# Patient Record
Sex: Female | Born: 1965 | Race: Black or African American | Hispanic: No | Marital: Married | Smoking: Never smoker
Health system: Southern US, Community
[De-identification: ages and names within clinical notes are randomized; demographics above are authoritative.]

## PROBLEM LIST (undated history)

## (undated) DIAGNOSIS — E039 Hypothyroidism, unspecified: Secondary | ICD-10-CM

## (undated) DIAGNOSIS — R519 Headache, unspecified: Secondary | ICD-10-CM

## (undated) DIAGNOSIS — R51 Headache: Secondary | ICD-10-CM

## (undated) DIAGNOSIS — I773 Arterial fibromuscular dysplasia: Secondary | ICD-10-CM

## (undated) DIAGNOSIS — I10 Essential (primary) hypertension: Secondary | ICD-10-CM

## (undated) DIAGNOSIS — F419 Anxiety disorder, unspecified: Secondary | ICD-10-CM

## (undated) DIAGNOSIS — R42 Dizziness and giddiness: Secondary | ICD-10-CM

## (undated) DIAGNOSIS — I639 Cerebral infarction, unspecified: Secondary | ICD-10-CM

## (undated) HISTORY — DX: Dizziness and giddiness: R42

## (undated) HISTORY — PX: NO PAST SURGERIES: SHX2092

## (undated) HISTORY — DX: Arterial fibromuscular dysplasia: I77.3

## (undated) HISTORY — DX: Cerebral infarction, unspecified: I63.9

---

## 1998-09-01 ENCOUNTER — Other Ambulatory Visit: Admission: RE | Admit: 1998-09-01 | Discharge: 1998-09-01 | Payer: Self-pay | Admitting: Obstetrics & Gynecology

## 1999-10-26 ENCOUNTER — Emergency Department (HOSPITAL_COMMUNITY): Admission: EM | Admit: 1999-10-26 | Discharge: 1999-10-26 | Payer: Self-pay | Admitting: Gastroenterology

## 1999-11-01 ENCOUNTER — Emergency Department (HOSPITAL_COMMUNITY): Admission: EM | Admit: 1999-11-01 | Discharge: 1999-11-02 | Payer: Self-pay

## 1999-11-16 ENCOUNTER — Ambulatory Visit (HOSPITAL_COMMUNITY): Admission: RE | Admit: 1999-11-16 | Discharge: 1999-11-16 | Payer: Self-pay | Admitting: Family Medicine

## 2000-05-23 ENCOUNTER — Other Ambulatory Visit: Admission: RE | Admit: 2000-05-23 | Discharge: 2000-05-23 | Payer: Self-pay | Admitting: Obstetrics & Gynecology

## 2002-09-22 ENCOUNTER — Encounter: Payer: Self-pay | Admitting: Emergency Medicine

## 2002-09-22 ENCOUNTER — Emergency Department (HOSPITAL_COMMUNITY): Admission: EM | Admit: 2002-09-22 | Discharge: 2002-09-22 | Payer: Self-pay | Admitting: Emergency Medicine

## 2002-10-25 ENCOUNTER — Other Ambulatory Visit: Admission: RE | Admit: 2002-10-25 | Discharge: 2002-10-25 | Payer: Self-pay | Admitting: Obstetrics & Gynecology

## 2003-06-27 ENCOUNTER — Inpatient Hospital Stay (HOSPITAL_COMMUNITY): Admission: AD | Admit: 2003-06-27 | Discharge: 2003-07-04 | Payer: Self-pay | Admitting: Emergency Medicine

## 2003-06-27 ENCOUNTER — Emergency Department (HOSPITAL_COMMUNITY): Admission: EM | Admit: 2003-06-27 | Discharge: 2003-06-27 | Payer: Self-pay

## 2003-08-06 ENCOUNTER — Encounter: Payer: Self-pay | Admitting: Emergency Medicine

## 2003-08-06 ENCOUNTER — Emergency Department (HOSPITAL_COMMUNITY): Admission: EM | Admit: 2003-08-06 | Discharge: 2003-08-07 | Payer: Self-pay | Admitting: Emergency Medicine

## 2003-12-09 ENCOUNTER — Other Ambulatory Visit: Admission: RE | Admit: 2003-12-09 | Discharge: 2003-12-09 | Payer: Self-pay | Admitting: Family Medicine

## 2004-01-05 ENCOUNTER — Other Ambulatory Visit: Admission: RE | Admit: 2004-01-05 | Discharge: 2004-01-05 | Payer: Self-pay | Admitting: Obstetrics & Gynecology

## 2004-04-10 ENCOUNTER — Emergency Department (HOSPITAL_COMMUNITY): Admission: EM | Admit: 2004-04-10 | Discharge: 2004-04-11 | Payer: Self-pay | Admitting: Emergency Medicine

## 2006-09-18 ENCOUNTER — Emergency Department (HOSPITAL_COMMUNITY): Admission: EM | Admit: 2006-09-18 | Discharge: 2006-09-19 | Payer: Self-pay | Admitting: Emergency Medicine

## 2011-12-16 DIAGNOSIS — E039 Hypothyroidism, unspecified: Secondary | ICD-10-CM | POA: Diagnosis not present

## 2011-12-23 DIAGNOSIS — E0590 Thyrotoxicosis, unspecified without thyrotoxic crisis or storm: Secondary | ICD-10-CM | POA: Diagnosis not present

## 2012-01-06 DIAGNOSIS — R928 Other abnormal and inconclusive findings on diagnostic imaging of breast: Secondary | ICD-10-CM | POA: Diagnosis not present

## 2012-05-30 DIAGNOSIS — Z1151 Encounter for screening for human papillomavirus (HPV): Secondary | ICD-10-CM | POA: Diagnosis not present

## 2012-05-30 DIAGNOSIS — Z1289 Encounter for screening for malignant neoplasm of other sites: Secondary | ICD-10-CM | POA: Diagnosis not present

## 2012-05-30 DIAGNOSIS — F329 Major depressive disorder, single episode, unspecified: Secondary | ICD-10-CM | POA: Diagnosis not present

## 2012-05-30 DIAGNOSIS — F341 Dysthymic disorder: Secondary | ICD-10-CM | POA: Diagnosis not present

## 2012-05-30 DIAGNOSIS — Z124 Encounter for screening for malignant neoplasm of cervix: Secondary | ICD-10-CM | POA: Diagnosis not present

## 2012-05-30 DIAGNOSIS — E0590 Thyrotoxicosis, unspecified without thyrotoxic crisis or storm: Secondary | ICD-10-CM | POA: Diagnosis not present

## 2012-05-30 DIAGNOSIS — R030 Elevated blood-pressure reading, without diagnosis of hypertension: Secondary | ICD-10-CM | POA: Diagnosis not present

## 2012-05-30 DIAGNOSIS — Z0142 Encounter for cervical smear to confirm findings of recent normal smear following initial abnormal smear: Secondary | ICD-10-CM | POA: Diagnosis not present

## 2012-05-30 DIAGNOSIS — Z79899 Other long term (current) drug therapy: Secondary | ICD-10-CM | POA: Diagnosis not present

## 2012-06-06 DIAGNOSIS — Z1231 Encounter for screening mammogram for malignant neoplasm of breast: Secondary | ICD-10-CM | POA: Diagnosis not present

## 2012-06-08 DIAGNOSIS — E0590 Thyrotoxicosis, unspecified without thyrotoxic crisis or storm: Secondary | ICD-10-CM | POA: Diagnosis not present

## 2012-12-27 DIAGNOSIS — E0590 Thyrotoxicosis, unspecified without thyrotoxic crisis or storm: Secondary | ICD-10-CM | POA: Diagnosis not present

## 2013-05-22 DIAGNOSIS — F3130 Bipolar disorder, current episode depressed, mild or moderate severity, unspecified: Secondary | ICD-10-CM | POA: Diagnosis not present

## 2013-07-24 DIAGNOSIS — F319 Bipolar disorder, unspecified: Secondary | ICD-10-CM | POA: Diagnosis not present

## 2013-07-24 DIAGNOSIS — E785 Hyperlipidemia, unspecified: Secondary | ICD-10-CM | POA: Diagnosis not present

## 2013-07-24 DIAGNOSIS — E0590 Thyrotoxicosis, unspecified without thyrotoxic crisis or storm: Secondary | ICD-10-CM | POA: Diagnosis not present

## 2013-08-20 DIAGNOSIS — F3130 Bipolar disorder, current episode depressed, mild or moderate severity, unspecified: Secondary | ICD-10-CM | POA: Diagnosis not present

## 2013-09-16 DIAGNOSIS — Z23 Encounter for immunization: Secondary | ICD-10-CM | POA: Diagnosis not present

## 2013-09-16 DIAGNOSIS — E0590 Thyrotoxicosis, unspecified without thyrotoxic crisis or storm: Secondary | ICD-10-CM | POA: Diagnosis not present

## 2013-10-14 DIAGNOSIS — E0590 Thyrotoxicosis, unspecified without thyrotoxic crisis or storm: Secondary | ICD-10-CM | POA: Diagnosis not present

## 2013-10-29 DIAGNOSIS — R05 Cough: Secondary | ICD-10-CM | POA: Diagnosis not present

## 2013-10-29 DIAGNOSIS — R059 Cough, unspecified: Secondary | ICD-10-CM | POA: Diagnosis not present

## 2013-10-29 DIAGNOSIS — R062 Wheezing: Secondary | ICD-10-CM | POA: Diagnosis not present

## 2013-10-29 DIAGNOSIS — J209 Acute bronchitis, unspecified: Secondary | ICD-10-CM | POA: Diagnosis not present

## 2013-10-29 DIAGNOSIS — S01309A Unspecified open wound of unspecified ear, initial encounter: Secondary | ICD-10-CM | POA: Diagnosis not present

## 2013-11-18 DIAGNOSIS — Z1231 Encounter for screening mammogram for malignant neoplasm of breast: Secondary | ICD-10-CM | POA: Diagnosis not present

## 2013-11-27 DIAGNOSIS — E785 Hyperlipidemia, unspecified: Secondary | ICD-10-CM | POA: Diagnosis not present

## 2014-01-07 DIAGNOSIS — F3130 Bipolar disorder, current episode depressed, mild or moderate severity, unspecified: Secondary | ICD-10-CM | POA: Diagnosis not present

## 2014-01-13 DIAGNOSIS — E0590 Thyrotoxicosis, unspecified without thyrotoxic crisis or storm: Secondary | ICD-10-CM | POA: Diagnosis not present

## 2014-01-17 DIAGNOSIS — E0590 Thyrotoxicosis, unspecified without thyrotoxic crisis or storm: Secondary | ICD-10-CM | POA: Diagnosis not present

## 2014-01-17 DIAGNOSIS — E0500 Thyrotoxicosis with diffuse goiter without thyrotoxic crisis or storm: Secondary | ICD-10-CM | POA: Diagnosis not present

## 2014-02-14 DIAGNOSIS — E0590 Thyrotoxicosis, unspecified without thyrotoxic crisis or storm: Secondary | ICD-10-CM | POA: Diagnosis not present

## 2014-03-18 DIAGNOSIS — Z0000 Encounter for general adult medical examination without abnormal findings: Secondary | ICD-10-CM | POA: Diagnosis not present

## 2014-03-18 DIAGNOSIS — E669 Obesity, unspecified: Secondary | ICD-10-CM | POA: Diagnosis not present

## 2014-03-18 DIAGNOSIS — Z6833 Body mass index (BMI) 33.0-33.9, adult: Secondary | ICD-10-CM | POA: Diagnosis not present

## 2014-03-18 DIAGNOSIS — E785 Hyperlipidemia, unspecified: Secondary | ICD-10-CM | POA: Diagnosis not present

## 2014-03-18 DIAGNOSIS — Z1331 Encounter for screening for depression: Secondary | ICD-10-CM | POA: Diagnosis not present

## 2014-03-18 DIAGNOSIS — E0590 Thyrotoxicosis, unspecified without thyrotoxic crisis or storm: Secondary | ICD-10-CM | POA: Diagnosis not present

## 2014-04-16 DIAGNOSIS — E0590 Thyrotoxicosis, unspecified without thyrotoxic crisis or storm: Secondary | ICD-10-CM | POA: Diagnosis not present

## 2014-04-21 DIAGNOSIS — R059 Cough, unspecified: Secondary | ICD-10-CM | POA: Diagnosis not present

## 2014-04-21 DIAGNOSIS — R05 Cough: Secondary | ICD-10-CM | POA: Diagnosis not present

## 2014-05-07 DIAGNOSIS — F3130 Bipolar disorder, current episode depressed, mild or moderate severity, unspecified: Secondary | ICD-10-CM | POA: Diagnosis not present

## 2014-05-28 DIAGNOSIS — E0590 Thyrotoxicosis, unspecified without thyrotoxic crisis or storm: Secondary | ICD-10-CM | POA: Diagnosis not present

## 2014-07-01 DIAGNOSIS — E0590 Thyrotoxicosis, unspecified without thyrotoxic crisis or storm: Secondary | ICD-10-CM | POA: Diagnosis not present

## 2014-08-29 DIAGNOSIS — E0500 Thyrotoxicosis with diffuse goiter without thyrotoxic crisis or storm: Secondary | ICD-10-CM | POA: Diagnosis not present

## 2014-08-29 DIAGNOSIS — E0590 Thyrotoxicosis, unspecified without thyrotoxic crisis or storm: Secondary | ICD-10-CM | POA: Diagnosis not present

## 2014-09-04 DIAGNOSIS — F250 Schizoaffective disorder, bipolar type: Secondary | ICD-10-CM | POA: Diagnosis not present

## 2014-09-08 DIAGNOSIS — I10 Essential (primary) hypertension: Secondary | ICD-10-CM | POA: Diagnosis not present

## 2014-09-08 DIAGNOSIS — E785 Hyperlipidemia, unspecified: Secondary | ICD-10-CM | POA: Diagnosis not present

## 2014-10-06 DIAGNOSIS — E0590 Thyrotoxicosis, unspecified without thyrotoxic crisis or storm: Secondary | ICD-10-CM | POA: Diagnosis not present

## 2014-11-17 DIAGNOSIS — E0590 Thyrotoxicosis, unspecified without thyrotoxic crisis or storm: Secondary | ICD-10-CM | POA: Diagnosis not present

## 2014-12-26 DIAGNOSIS — E0590 Thyrotoxicosis, unspecified without thyrotoxic crisis or storm: Secondary | ICD-10-CM | POA: Diagnosis not present

## 2014-12-26 DIAGNOSIS — E0500 Thyrotoxicosis with diffuse goiter without thyrotoxic crisis or storm: Secondary | ICD-10-CM | POA: Diagnosis not present

## 2015-01-01 DIAGNOSIS — F250 Schizoaffective disorder, bipolar type: Secondary | ICD-10-CM | POA: Diagnosis not present

## 2015-02-02 DIAGNOSIS — E0590 Thyrotoxicosis, unspecified without thyrotoxic crisis or storm: Secondary | ICD-10-CM | POA: Diagnosis not present

## 2015-03-03 DIAGNOSIS — Z124 Encounter for screening for malignant neoplasm of cervix: Secondary | ICD-10-CM | POA: Diagnosis not present

## 2015-03-03 DIAGNOSIS — Z1231 Encounter for screening mammogram for malignant neoplasm of breast: Secondary | ICD-10-CM | POA: Diagnosis not present

## 2015-03-03 DIAGNOSIS — Z1212 Encounter for screening for malignant neoplasm of rectum: Secondary | ICD-10-CM | POA: Diagnosis not present

## 2015-03-06 DIAGNOSIS — E0500 Thyrotoxicosis with diffuse goiter without thyrotoxic crisis or storm: Secondary | ICD-10-CM | POA: Diagnosis not present

## 2015-03-06 DIAGNOSIS — E0590 Thyrotoxicosis, unspecified without thyrotoxic crisis or storm: Secondary | ICD-10-CM | POA: Diagnosis not present

## 2015-03-11 ENCOUNTER — Other Ambulatory Visit: Payer: Self-pay | Admitting: Obstetrics & Gynecology

## 2015-03-11 DIAGNOSIS — R928 Other abnormal and inconclusive findings on diagnostic imaging of breast: Secondary | ICD-10-CM

## 2015-03-17 ENCOUNTER — Ambulatory Visit
Admission: RE | Admit: 2015-03-17 | Discharge: 2015-03-17 | Disposition: A | Discharge status: Home / Self Care | Payer: Medicare Other | Source: Ambulatory Visit | Attending: Obstetrics & Gynecology | Admitting: Obstetrics & Gynecology

## 2015-03-17 DIAGNOSIS — R921 Mammographic calcification found on diagnostic imaging of breast: Secondary | ICD-10-CM | POA: Diagnosis not present

## 2015-03-17 DIAGNOSIS — R928 Other abnormal and inconclusive findings on diagnostic imaging of breast: Secondary | ICD-10-CM

## 2015-03-20 DIAGNOSIS — E0500 Thyrotoxicosis with diffuse goiter without thyrotoxic crisis or storm: Secondary | ICD-10-CM | POA: Diagnosis not present

## 2015-03-20 DIAGNOSIS — E785 Hyperlipidemia, unspecified: Secondary | ICD-10-CM | POA: Diagnosis not present

## 2015-03-20 DIAGNOSIS — Z1389 Encounter for screening for other disorder: Secondary | ICD-10-CM | POA: Diagnosis not present

## 2015-03-20 DIAGNOSIS — F3170 Bipolar disorder, currently in remission, most recent episode unspecified: Secondary | ICD-10-CM | POA: Diagnosis not present

## 2015-03-20 DIAGNOSIS — Z0000 Encounter for general adult medical examination without abnormal findings: Secondary | ICD-10-CM | POA: Diagnosis not present

## 2015-04-02 DIAGNOSIS — R05 Cough: Secondary | ICD-10-CM | POA: Diagnosis not present

## 2015-06-05 DIAGNOSIS — E0590 Thyrotoxicosis, unspecified without thyrotoxic crisis or storm: Secondary | ICD-10-CM | POA: Diagnosis not present

## 2015-07-09 DIAGNOSIS — F250 Schizoaffective disorder, bipolar type: Secondary | ICD-10-CM | POA: Diagnosis not present

## 2015-07-13 DIAGNOSIS — E0590 Thyrotoxicosis, unspecified without thyrotoxic crisis or storm: Secondary | ICD-10-CM | POA: Diagnosis not present

## 2015-08-17 DIAGNOSIS — E0590 Thyrotoxicosis, unspecified without thyrotoxic crisis or storm: Secondary | ICD-10-CM | POA: Diagnosis not present

## 2015-08-24 ENCOUNTER — Other Ambulatory Visit: Payer: Self-pay | Admitting: Obstetrics & Gynecology

## 2015-08-24 DIAGNOSIS — F419 Anxiety disorder, unspecified: Secondary | ICD-10-CM | POA: Diagnosis not present

## 2015-08-24 DIAGNOSIS — R921 Mammographic calcification found on diagnostic imaging of breast: Secondary | ICD-10-CM

## 2015-08-24 DIAGNOSIS — R079 Chest pain, unspecified: Secondary | ICD-10-CM | POA: Diagnosis not present

## 2015-08-24 DIAGNOSIS — M940 Chondrocostal junction syndrome [Tietze]: Secondary | ICD-10-CM | POA: Diagnosis not present

## 2015-09-09 ENCOUNTER — Ambulatory Visit
Admission: RE | Admit: 2015-09-09 | Discharge: 2015-09-09 | Disposition: A | Discharge status: Home / Self Care | Payer: Medicare Other | Source: Ambulatory Visit | Attending: Obstetrics & Gynecology | Admitting: Obstetrics & Gynecology

## 2015-09-09 ENCOUNTER — Other Ambulatory Visit: Payer: Self-pay | Admitting: Obstetrics & Gynecology

## 2015-09-09 DIAGNOSIS — N6320 Unspecified lump in the left breast, unspecified quadrant: Secondary | ICD-10-CM

## 2015-09-09 DIAGNOSIS — R921 Mammographic calcification found on diagnostic imaging of breast: Secondary | ICD-10-CM

## 2015-09-09 DIAGNOSIS — N6002 Solitary cyst of left breast: Secondary | ICD-10-CM | POA: Diagnosis not present

## 2015-09-14 DIAGNOSIS — E0590 Thyrotoxicosis, unspecified without thyrotoxic crisis or storm: Secondary | ICD-10-CM | POA: Diagnosis not present

## 2015-09-15 DIAGNOSIS — E032 Hypothyroidism due to medicaments and other exogenous substances: Secondary | ICD-10-CM | POA: Diagnosis not present

## 2015-09-15 DIAGNOSIS — Z23 Encounter for immunization: Secondary | ICD-10-CM | POA: Diagnosis not present

## 2015-09-15 DIAGNOSIS — E0500 Thyrotoxicosis with diffuse goiter without thyrotoxic crisis or storm: Secondary | ICD-10-CM | POA: Diagnosis not present

## 2015-10-08 DIAGNOSIS — Z1211 Encounter for screening for malignant neoplasm of colon: Secondary | ICD-10-CM | POA: Diagnosis not present

## 2015-10-08 DIAGNOSIS — E785 Hyperlipidemia, unspecified: Secondary | ICD-10-CM | POA: Diagnosis not present

## 2015-10-14 DIAGNOSIS — E0500 Thyrotoxicosis with diffuse goiter without thyrotoxic crisis or storm: Secondary | ICD-10-CM | POA: Diagnosis not present

## 2015-10-14 DIAGNOSIS — E785 Hyperlipidemia, unspecified: Secondary | ICD-10-CM | POA: Diagnosis not present

## 2015-12-09 ENCOUNTER — Other Ambulatory Visit: Payer: Self-pay | Admitting: Gastroenterology

## 2015-12-25 DIAGNOSIS — E0500 Thyrotoxicosis with diffuse goiter without thyrotoxic crisis or storm: Secondary | ICD-10-CM | POA: Diagnosis not present

## 2016-01-06 DIAGNOSIS — F250 Schizoaffective disorder, bipolar type: Secondary | ICD-10-CM | POA: Diagnosis not present

## 2016-01-18 ENCOUNTER — Encounter (HOSPITAL_COMMUNITY): Payer: Self-pay | Admitting: *Deleted

## 2016-01-26 ENCOUNTER — Ambulatory Visit (HOSPITAL_COMMUNITY): Payer: Medicare Other | Admitting: Anesthesiology

## 2016-01-26 ENCOUNTER — Encounter (HOSPITAL_COMMUNITY): Payer: Self-pay

## 2016-01-26 ENCOUNTER — Encounter (HOSPITAL_COMMUNITY)
Admission: RE | Disposition: A | Discharge status: Home / Self Care | Payer: Self-pay | Source: Ambulatory Visit | Attending: Gastroenterology

## 2016-01-26 ENCOUNTER — Ambulatory Visit (HOSPITAL_COMMUNITY)
Admission: RE | Admit: 2016-01-26 | Discharge: 2016-01-26 | Disposition: A | Discharge status: Home / Self Care | Payer: Medicare Other | Source: Ambulatory Visit | Attending: Gastroenterology | Admitting: Gastroenterology

## 2016-01-26 DIAGNOSIS — F419 Anxiety disorder, unspecified: Secondary | ICD-10-CM | POA: Diagnosis not present

## 2016-01-26 DIAGNOSIS — I10 Essential (primary) hypertension: Secondary | ICD-10-CM | POA: Diagnosis not present

## 2016-01-26 DIAGNOSIS — F319 Bipolar disorder, unspecified: Secondary | ICD-10-CM | POA: Diagnosis not present

## 2016-01-26 DIAGNOSIS — E0590 Thyrotoxicosis, unspecified without thyrotoxic crisis or storm: Secondary | ICD-10-CM | POA: Insufficient documentation

## 2016-01-26 DIAGNOSIS — Z1211 Encounter for screening for malignant neoplasm of colon: Secondary | ICD-10-CM | POA: Diagnosis not present

## 2016-01-26 DIAGNOSIS — E7800 Pure hypercholesterolemia, unspecified: Secondary | ICD-10-CM | POA: Insufficient documentation

## 2016-01-26 HISTORY — PX: COLONOSCOPY WITH PROPOFOL: SHX5780

## 2016-01-26 HISTORY — DX: Anxiety disorder, unspecified: F41.9

## 2016-01-26 HISTORY — DX: Headache, unspecified: R51.9

## 2016-01-26 HISTORY — DX: Essential (primary) hypertension: I10

## 2016-01-26 HISTORY — DX: Headache: R51

## 2016-01-26 HISTORY — DX: Hypothyroidism, unspecified: E03.9

## 2016-01-26 SURGERY — COLONOSCOPY WITH PROPOFOL
Anesthesia: Monitor Anesthesia Care

## 2016-01-26 MED ORDER — PROPOFOL 10 MG/ML IV BOLUS
INTRAVENOUS | Status: AC
  Filled 2016-01-26: qty 40

## 2016-01-26 MED ORDER — LIDOCAINE HCL (CARDIAC) 20 MG/ML IV SOLN
INTRAVENOUS | Status: AC
  Filled 2016-01-26: qty 5

## 2016-01-26 MED ORDER — LACTATED RINGERS IV SOLN
INTRAVENOUS | Status: DC
  Administered 2016-01-26: 12:00:00 via INTRAVENOUS

## 2016-01-26 MED ORDER — LIDOCAINE HCL (CARDIAC) 20 MG/ML IV SOLN
INTRAVENOUS | Status: DC | PRN
  Administered 2016-01-26: 50 mg via INTRAVENOUS

## 2016-01-26 MED ORDER — PROPOFOL 10 MG/ML IV BOLUS
INTRAVENOUS | Status: DC | PRN
  Administered 2016-01-26 (×2): 20 mg via INTRAVENOUS

## 2016-01-26 MED ORDER — SODIUM CHLORIDE 0.9 % IV SOLN: INTRAVENOUS | Status: DC

## 2016-01-26 MED ORDER — PROPOFOL 500 MG/50ML IV EMUL
INTRAVENOUS | Status: DC | PRN
  Administered 2016-01-26: 130 ug/kg/min via INTRAVENOUS

## 2016-01-26 MED ORDER — PROPOFOL 10 MG/ML IV BOLUS: INTRAVENOUS | Status: DC | PRN

## 2016-01-26 SURGICAL SUPPLY — 22 items

## 2016-01-26 NOTE — Anesthesia Preprocedure Evaluation (Addendum)
Anesthesia Evaluation  Patient identified by MRN, date of birth, ID band Patient awake    Reviewed: Allergy & Precautions, NPO status , Patient's Chart, lab work & pertinent test results  Airway Mallampati: II  TM Distance: >3 FB Neck ROM: Full    Dental   Pulmonary neg pulmonary ROS,    breath sounds clear to auscultation       Cardiovascular hypertension, Pt. on medications and Pt. on home beta blockers  Rhythm:Regular Rate:Normal     Neuro/Psych Anxiety negative neurological ROS     GI/Hepatic negative GI ROS, Neg liver ROS,   Endo/Other  Hyperthyroidism   Renal/GU negative Renal ROS     Musculoskeletal   Abdominal   Peds  Hematology negative hematology ROS (+)   Anesthesia Other Findings   Reproductive/Obstetrics                            Anesthesia Physical Anesthesia Plan  ASA: II  Anesthesia Plan: MAC   Post-op Pain Management:    Induction: Intravenous  Airway Management Planned: Natural Airway and Simple Face Mask  Additional Equipment:   Intra-op Plan:   Post-operative Plan:   Informed Consent: I have reviewed the patients History and Physical, chart, labs and discussed the procedure including the risks, benefits and alternatives for the proposed anesthesia with the patient or authorized representative who has indicated his/her understanding and acceptance.     Plan Discussed with: CRNA  Anesthesia Plan Comments:         Anesthesia Quick Evaluation

## 2016-01-26 NOTE — Op Note (Signed)
[facility] Patient Name: Deborah SchatzCia Piatkowski Procedure Date: 01/26/2016 MRN: [id number] Attending MD: Charolett BumpersMartin K Johnson , MD Date of Birth: [birth year] CSN:  Age: 50 Admit Type: Outpatient Procedure:                Colonoscopy Indications:              Screening for colorectal malignant neoplasm Providers:                Charolett BumpersMartin K. Johnson, MD, Anthony Saraniel Madden, RN, Lorenda IshiharaSam                            Tetteh, Technician, Jarvis NewcomerLacey Armistead, CRNA Referring MD:              Medicines:                Propofol per Anesthesia Complications:            No immediate complications. Estimated Blood Loss:     Estimated blood loss: none. Procedure:                Pre-Anesthesia Assessment:                           - Prior to the procedure, a History and Physical                            was performed, and patient medications and                            allergies were reviewed. The patient's tolerance of                            previous anesthesia was also reviewed. The risks                            and benefits of the procedure and the sedation                            options and risks were discussed with the patient.                            All questions were answered, and informed consent                            was obtained. Prior Anticoagulants: The patient has                            taken no previous anticoagulant or antiplatelet                            agents. ASA Grade Assessment: II - A patient with                            mild systemic disease. After reviewing the risks  and benefits, the patient was deemed in                            satisfactory condition to undergo the procedure.                           After obtaining informed consent, the colonoscope                            was passed under direct vision. Throughout the                            procedure, the patient's blood pressure, pulse, and   oxygen saturations were monitored continuously. The                            EC-[id number] ([id number]) scope was introduced through                            the anus and advanced to the the cecum, identified                            by appendiceal orifice and ileocecal valve. The                            colonoscopy was somewhat difficult due to                            significant looping. The patient tolerated the                            procedure well. The quality of the bowel                            preparation was good. The appendiceal orifice and                            the rectum were photographed. Scope In: 1:19:41 PM Scope Out: 1:42:06 PM Scope Withdrawal Time: 0 hours 8 minutes 11 seconds  Total Procedure Duration: 0 hours 22 minutes 25 seconds  Findings:      The perianal and digital rectal examinations were normal.      The entire examined colon appeared normal. Impression:               - The entire examined colon is normal.                           - No specimens collected. Moderate Sedation:      N/A- Per Anesthesia Care Recommendation:           - Patient has a contact number available for                            emergencies. The signs and symptoms of potential  delayed complications were discussed with the                            patient. Return to normal activities tomorrow.                            Written discharge instructions were provided to the                            patient.                           - Repeat colonoscopy in 10 years for screening                            purposes.                           - Resume previous diet.                           - Continue present medications. Procedure Code(s):        --- Professional ---                           [phone], Colonoscopy, flexible; diagnostic, including                            collection of specimen(s) by brushing or washing,                             when performed (separate procedure) Diagnosis Code(s):        --- Professional ---                           Z12.11, Encounter for screening for malignant                            neoplasm of colon CPT copyright 2016 [employer]. All rights reserved. The codes documented in this report are preliminary and upon coder review may  be revised to meet current compliance requirements. Danise Edge, MD Charolett Bumpers, MD 01/26/2016 1:46:43 PM This report has been signed electronically. Number of Addenda: 0

## 2016-01-26 NOTE — H&P (Signed)
  Procedure: Baseline screening colonoscopy  History: The patient is a 50 year old female born 14-Jun-1966. She is scheduled to undergo her first screening colonoscopy with polypectomy to prevent colon cancer.  Past medical history: Graves hyperthyroidism. Hypercholesterolemia. Bipolar depression.  Medication allergies: None  Family history: Negative for colon cancer  Exam: The patient is alert and lying comfortably on the endoscopy stretcher. Abdomen is soft and nontender to palpation. Lungs are clear to auscultation. Cardiac exam reveals a regular rhythm.  Plan: Proceed with screening colonoscopy

## 2016-01-26 NOTE — Anesthesia Postprocedure Evaluation (Signed)
Anesthesia Post Note  Patient: Deborah Moore  Procedure(s) Performed: Procedure(s) (LRB): COLONOSCOPY WITH PROPOFOL (N/A)  Patient location during evaluation: PACU Anesthesia Type: MAC Level of consciousness: awake and alert Pain management: pain level controlled Vital Signs Assessment: post-procedure vital signs reviewed and stable Respiratory status: spontaneous breathing, nonlabored ventilation, respiratory function stable and patient connected to nasal cannula oxygen Cardiovascular status: stable and blood pressure returned to baseline Anesthetic complications: no    Last Vitals:  Filed Vitals:   01/26/16 1400 01/26/16 1410  BP: 148/82 160/98  Pulse: 79 84  Temp:    Resp: 22 18    Last Pain: There were no vitals filed for this visit.               Kennieth RadFitzgerald, Shaunie Boehm E

## 2016-01-26 NOTE — Transfer of Care (Signed)
Immediate Anesthesia Transfer of Care Note  Patient: Deborah Moore  Procedure(s) Performed: Procedure(s): COLONOSCOPY WITH PROPOFOL (N/A)  Patient Location: PACU and Endoscopy Unit  Anesthesia Type:MAC  Level of Consciousness: awake, alert , oriented and patient cooperative  Airway & Oxygen Therapy: Patient Spontanous Breathing and Patient connected to face mask oxygen  Post-op Assessment: Report given to RN, Post -op Vital signs reviewed and stable and Patient moving all extremities  Post vital signs: Reviewed and stable  Last Vitals:  Filed Vitals:   01/26/16 1133  BP: 125/71  Pulse: 78  Temp: 36.7 C  Resp: 13    Complications: No apparent anesthesia complications

## 2016-01-26 NOTE — Discharge Instructions (Signed)

## 2016-01-26 NOTE — Anesthesia Procedure Notes (Signed)
Procedure Name: MAC Date/Time: 01/26/2016 1:12 PM Performed by: Jarvis NewcomerARMISTEAD, Noora Locascio A Pre-anesthesia Checklist: Timeout performed, Patient identified, Emergency Drugs available, Suction available and Patient being monitored Patient Re-evaluated:Patient Re-evaluated prior to inductionOxygen Delivery Method: Simple face mask Dental Injury: Teeth and Oropharynx as per pre-operative assessment

## 2016-01-27 ENCOUNTER — Encounter (HOSPITAL_COMMUNITY): Payer: Self-pay | Admitting: Gastroenterology

## 2016-02-16 ENCOUNTER — Other Ambulatory Visit: Payer: Self-pay

## 2016-02-16 DIAGNOSIS — Z1231 Encounter for screening mammogram for malignant neoplasm of breast: Secondary | ICD-10-CM

## 2016-02-19 DIAGNOSIS — Z23 Encounter for immunization: Secondary | ICD-10-CM | POA: Diagnosis not present

## 2016-02-19 DIAGNOSIS — E0590 Thyrotoxicosis, unspecified without thyrotoxic crisis or storm: Secondary | ICD-10-CM | POA: Diagnosis not present

## 2016-02-19 DIAGNOSIS — E0500 Thyrotoxicosis with diffuse goiter without thyrotoxic crisis or storm: Secondary | ICD-10-CM | POA: Diagnosis not present

## 2016-03-23 ENCOUNTER — Ambulatory Visit
Admission: RE | Admit: 2016-03-23 | Discharge: 2016-03-23 | Disposition: A | Discharge status: Home / Self Care | Payer: Medicare Other | Source: Ambulatory Visit

## 2016-03-23 DIAGNOSIS — Z1231 Encounter for screening mammogram for malignant neoplasm of breast: Secondary | ICD-10-CM | POA: Diagnosis not present

## 2016-04-05 DIAGNOSIS — E0590 Thyrotoxicosis, unspecified without thyrotoxic crisis or storm: Secondary | ICD-10-CM | POA: Diagnosis not present

## 2016-05-31 DIAGNOSIS — E0500 Thyrotoxicosis with diffuse goiter without thyrotoxic crisis or storm: Secondary | ICD-10-CM | POA: Diagnosis not present

## 2016-07-01 DIAGNOSIS — J209 Acute bronchitis, unspecified: Secondary | ICD-10-CM | POA: Diagnosis not present

## 2016-07-06 DIAGNOSIS — F250 Schizoaffective disorder, bipolar type: Secondary | ICD-10-CM | POA: Diagnosis not present

## 2016-07-18 DIAGNOSIS — E0500 Thyrotoxicosis with diffuse goiter without thyrotoxic crisis or storm: Secondary | ICD-10-CM | POA: Diagnosis not present

## 2016-08-02 DIAGNOSIS — J0100 Acute maxillary sinusitis, unspecified: Secondary | ICD-10-CM | POA: Diagnosis not present

## 2016-08-02 DIAGNOSIS — R05 Cough: Secondary | ICD-10-CM | POA: Diagnosis not present

## 2016-08-02 DIAGNOSIS — R0982 Postnasal drip: Secondary | ICD-10-CM | POA: Diagnosis not present

## 2016-08-18 DIAGNOSIS — Z23 Encounter for immunization: Secondary | ICD-10-CM | POA: Diagnosis not present

## 2016-08-26 DIAGNOSIS — E0590 Thyrotoxicosis, unspecified without thyrotoxic crisis or storm: Secondary | ICD-10-CM | POA: Diagnosis not present

## 2016-08-26 DIAGNOSIS — E0500 Thyrotoxicosis with diffuse goiter without thyrotoxic crisis or storm: Secondary | ICD-10-CM | POA: Diagnosis not present

## 2016-09-13 DIAGNOSIS — M940 Chondrocostal junction syndrome [Tietze]: Secondary | ICD-10-CM | POA: Diagnosis not present

## 2016-09-13 DIAGNOSIS — R05 Cough: Secondary | ICD-10-CM | POA: Diagnosis not present

## 2016-09-16 ENCOUNTER — Emergency Department (HOSPITAL_COMMUNITY): Payer: Medicare Other

## 2016-09-16 ENCOUNTER — Emergency Department (HOSPITAL_COMMUNITY)
Admission: EM | Admit: 2016-09-16 | Discharge: 2016-09-17 | Disposition: A | Discharge status: Home / Self Care | Payer: Medicare Other | Attending: Emergency Medicine | Admitting: Emergency Medicine

## 2016-09-16 ENCOUNTER — Encounter (HOSPITAL_COMMUNITY): Payer: Self-pay | Admitting: Emergency Medicine

## 2016-09-16 DIAGNOSIS — I10 Essential (primary) hypertension: Secondary | ICD-10-CM | POA: Insufficient documentation

## 2016-09-16 DIAGNOSIS — R062 Wheezing: Secondary | ICD-10-CM | POA: Diagnosis not present

## 2016-09-16 DIAGNOSIS — Z79899 Other long term (current) drug therapy: Secondary | ICD-10-CM | POA: Insufficient documentation

## 2016-09-16 DIAGNOSIS — R0789 Other chest pain: Secondary | ICD-10-CM | POA: Diagnosis not present

## 2016-09-16 DIAGNOSIS — R739 Hyperglycemia, unspecified: Secondary | ICD-10-CM | POA: Insufficient documentation

## 2016-09-16 DIAGNOSIS — E039 Hypothyroidism, unspecified: Secondary | ICD-10-CM | POA: Insufficient documentation

## 2016-09-16 DIAGNOSIS — J40 Bronchitis, not specified as acute or chronic: Secondary | ICD-10-CM | POA: Insufficient documentation

## 2016-09-16 DIAGNOSIS — R0602 Shortness of breath: Secondary | ICD-10-CM | POA: Diagnosis not present

## 2016-09-16 DIAGNOSIS — R05 Cough: Secondary | ICD-10-CM | POA: Diagnosis not present

## 2016-09-16 DIAGNOSIS — E876 Hypokalemia: Secondary | ICD-10-CM | POA: Diagnosis not present

## 2016-09-16 LAB — CBC
HEMATOCRIT: 40.2 % (ref 36.0–46.0)
Hemoglobin: 13.2 g/dL (ref 12.0–15.0)
MCH: 31.6 pg (ref 26.0–34.0)
MCHC: 32.8 g/dL (ref 30.0–36.0)
MCV: 96.2 fL (ref 78.0–100.0)
Platelets: 237 10*3/uL (ref 150–400)
RBC: 4.18 MIL/uL (ref 3.87–5.11)
RDW: 13.6 % (ref 11.5–15.5)
WBC: 10.2 10*3/uL (ref 4.0–10.5)

## 2016-09-16 LAB — BASIC METABOLIC PANEL
Anion gap: 11 (ref 5–15)
BUN: 8 mg/dL (ref 6–20)
CHLORIDE: 107 mmol/L (ref 101–111)
CO2: 22 mmol/L (ref 22–32)
Calcium: 9.1 mg/dL (ref 8.9–10.3)
Creatinine, Ser: 1.02 mg/dL — ABNORMAL HIGH (ref 0.44–1.00)
GFR calc Af Amer: >60 mL/min (ref >60)
GFR calc non Af Amer: >60 mL/min (ref >60)
GLUCOSE: 145 mg/dL — AB (ref 65–99)
POTASSIUM: 2.8 mmol/L — AB (ref 3.5–5.1)
Sodium: 140 mmol/L (ref 135–145)

## 2016-09-16 LAB — I-STAT TROPONIN, ED: Troponin i, poc: 0 ng/mL (ref 0.00–0.08)

## 2016-09-16 LAB — D-DIMER, QUANTITATIVE: D-Dimer, Quant: 1.16 ug/mL-FEU — ABNORMAL HIGH (ref 0.00–0.50)

## 2016-09-16 MED ORDER — ALBUTEROL (5 MG/ML) CONTINUOUS INHALATION SOLN
10.0000 mg/h | INHALATION_SOLUTION | Freq: Once | RESPIRATORY_TRACT | Status: AC
  Administered 2016-09-16: 10 mg/h via RESPIRATORY_TRACT
  Filled 2016-09-16: qty 20

## 2016-09-16 MED ORDER — IOPAMIDOL (ISOVUE-370) INJECTION 76%
INTRAVENOUS | Status: AC
  Filled 2016-09-16: qty 100

## 2016-09-16 MED ORDER — POTASSIUM CHLORIDE CRYS ER 20 MEQ PO TBCR
40.0000 meq | EXTENDED_RELEASE_TABLET | Freq: Once | ORAL | Status: AC
  Administered 2016-09-16: 40 meq via ORAL
  Filled 2016-09-16: qty 2

## 2016-09-16 MED ORDER — ALBUTEROL SULFATE (2.5 MG/3ML) 0.083% IN NEBU
5.0000 mg | INHALATION_SOLUTION | Freq: Once | RESPIRATORY_TRACT | Status: AC
Start: 2016-09-16 — End: 2016-09-16
  Administered 2016-09-16: 5 mg via RESPIRATORY_TRACT
  Filled 2016-09-16: qty 6

## 2016-09-16 MED ORDER — SODIUM CHLORIDE 0.9 % IJ SOLN
INTRAMUSCULAR | Status: AC
  Filled 2016-09-16: qty 50

## 2016-09-16 MED ORDER — IOPAMIDOL (ISOVUE-370) INJECTION 76%
100.0000 mL | Freq: Once | INTRAVENOUS | Status: AC | PRN
  Administered 2016-09-16: 100 mL via INTRAVENOUS

## 2016-09-16 MED ORDER — ALBUTEROL SULFATE HFA 108 (90 BASE) MCG/ACT IN AERS
2.0000 | INHALATION_SPRAY | Freq: Once | RESPIRATORY_TRACT | Status: AC
  Administered 2016-09-16: 2 via RESPIRATORY_TRACT
  Filled 2016-09-16: qty 6.7

## 2016-09-16 MED ORDER — ALBUTEROL SULFATE (2.5 MG/3ML) 0.083% IN NEBU
2.5000 mg | INHALATION_SOLUTION | Freq: Once | RESPIRATORY_TRACT | Status: AC
  Administered 2016-09-16: 2.5 mg via RESPIRATORY_TRACT
  Filled 2016-09-16: qty 3

## 2016-09-16 MED ORDER — DEXAMETHASONE 4 MG PO TABS
10.0000 mg | ORAL_TABLET | Freq: Once | ORAL | Status: AC
  Administered 2016-09-16: 10 mg via ORAL
  Filled 2016-09-16: qty 2

## 2016-09-16 NOTE — ED Provider Notes (Addendum)
[facility]-EMERGENCY DEPT Provider Note   CSN: [id number] Arrival date & time: 09/16/16  1535     History   Chief Complaint Chief Complaint  Patient presents with  . Shortness of Breath    HPI Deborah Moore is a 50 y.o. female.  HPI Complains of productive cough, yellow sputum shortness of breath and wheezing for 2 weeks. Also complains of chest pain left-sided anterior going from mid clavicular line to nipple which lasts 2 seconds at a time worse with coughing onset yesterday. No chest pain presently. Chest pain is nonexertional. She denies fever. [facility], treated with Sudafed and nasal spray, she does feel somewhat better today than yesterday. Denies fever. No other associated symptoms Past Medical History:  Diagnosis Date  . Anxiety   . Headache    migraines  . Hypertension   . Hypothyroidism   Bipolar disorder Hyperthyroidism not hypothyroid There are no active problems to display for this patient.   Past Surgical History:  Procedure Laterality Date  . COLONOSCOPY WITH PROPOFOL N/A 01/26/2016   Procedure: COLONOSCOPY WITH PROPOFOL;  Surgeon: Charolett BumpersMartin K Johnson, MD;  Location: [facility] ENDOSCOPY;  Service: Endoscopy;  Laterality: N/A;  . NO PAST SURGERIES      OB History    No data available       Home Medications    Prior to Admission medications   Medication Sig Start Date End Date Taking? Authorizing Provider  atenolol (TENORMIN) 25 MG tablet Take 25 mg by mouth every morning. 12/30/15   Historical Provider, MD  clonazePAM (KLONOPIN) 1 MG tablet Take 1 mg by mouth 3 (three) times daily. 01/15/16   Historical Provider, MD  guaiFENesin-dextromethorphan (ROBITUSSIN DM) 100-10 MG/5ML syrup Take 5 mLs by mouth every 4 (four) hours as needed for cough.    Historical Provider, MD  haloperidol (HALDOL) 5 MG tablet Take 5 mg by mouth at bedtime. 12/22/15   Historical Provider, MD  methimazole (TAPAZOLE) 10 MG tablet Take 10 mg by mouth every other day.  12/18/15   Historical Provider, MD  sertraline (ZOLOFT) 100 MG tablet Take 100 mg by mouth every morning. 12/30/15   Historical Provider, MD  simvastatin (ZOCOR) 20 MG tablet Take 20 mg by mouth every evening. 12/29/15   Historical Provider, MD  topiramate (TOPAMAX) 50 MG tablet Take 50 mg by mouth 2 (two) times daily. 12/22/15   Historical Provider, MD    Family History No family history on file.  Social History Social History  Substance Use Topics  . Smoking status: Never Smoker  . Smokeless tobacco: Not on file  . Alcohol use No     Allergies   Patient has no known allergies.   Review of Systems Review of Systems  Constitutional: Negative.   HENT: Negative.   Respiratory: Positive for cough and wheezing.   Cardiovascular: Positive for chest pain.  Gastrointestinal: Negative.   Musculoskeletal: Negative.   Skin: Negative.   Neurological: Negative.   Psychiatric/Behavioral: Negative.   All other systems reviewed and are negative.    Physical Exam Updated Vital Signs BP 146/92 (BP Location: Left Arm)   Pulse (!) 121   Temp 98 F (36.7 C) (Oral)   Resp 22   Wt 199 lb (90.3 kg)   SpO2 97%   BMI 38.86 kg/m   Physical Exam  Constitutional: She appears well-developed and well-nourished.  HENT:  Head: Normocephalic and atraumatic.  Eyes: Conjunctivae are normal. Pupils are equal, round, and reactive to light.  Neck: Neck supple. No tracheal deviation present. No thyromegaly present.  Cardiovascular: Regular rhythm.   No murmur heard. Tachycardic  Pulmonary/Chest: Effort normal. She has wheezes.  Prolonged expiratory phase with expiratory wheezes  Abdominal: Soft. Bowel sounds are normal. She exhibits no distension. There is no tenderness.  Obese  Musculoskeletal: Normal range of motion. She exhibits no edema or tenderness.  Neurological: She is alert. Coordination normal.  Skin: Skin is warm and dry. No rash noted.  Psychiatric: She has a normal mood and affect.    Nursing note and vitals reviewed.    ED Treatments / Results  Labs (all labs ordered are listed, but only abnormal results are displayed) Labs Reviewed  BASIC METABOLIC PANEL  CBC  I-STAT TROPOININ, ED    EKG  EKG Interpretation  Date/Time:  Friday September 16 2016 15:57:53 EST Ventricular Rate:  120 PR Interval:    QRS Duration: 86 QT Interval:  339 QTC Calculation: 481 R Axis:   69 Text Interpretation:  Sinus tachycardia Consider right atrial enlargement Low voltage, precordial leads Borderline T abnormalities, anterior leads Baseline wander in lead(s) II III aVR aVL aVF V1 V2 V3 V4 V5 V6 SINCE LAST TRACING HEART RATE HAS INCREASED Confirmed by Ethelda Chick  MD, Nicco Reaume [phone]) on 09/16/2016 5:16:16 PM     Chest x-ray viewed by me  Radiology Dg Chest 2 View  Result Date: 09/16/2016 CLINICAL DATA:  Pt states having an on and off cough for the last year. Not taking HTN meds. Not diabetic. Nonsmoker. No hx of asthma. Pt has anxiety attacks. Pt states SOB at times with cough. Pt seems to have some level of an altered mental status today. EXAM: CHEST  2 VIEW COMPARISON:  None. FINDINGS: Cardiomediastinal silhouette is normal in size and configuration. Lungs are clear. Lung volumes are normal. No evidence of pneumonia. No pleural effusion. No pneumothorax. Osseous and soft tissue structures about the chest are unremarkable. IMPRESSION: Normal chest x-ray.  No evidence of pneumonia or pulmonary edema. Electronically Signed   By: Bary Richard M.D.   On: 09/16/2016 16:48    Procedures Procedures (including critical care time)  Medications Ordered in ED Medications  albuterol (PROVENTIL) (2.5 MG/3ML) 0.083% nebulizer solution 5 mg (5 mg Nebulization Given 09/16/16 1601)   Results for orders placed or performed during the hospital encounter of 09/16/16  Basic metabolic panel  Result Value Ref Range   Sodium 140 135 - 145 mmol/L   Potassium 2.8 (L) 3.5 - 5.1 mmol/L   Chloride 107  101 - 111 mmol/L   CO2 22 22 - 32 mmol/L   Glucose, Bld 145 (H) 65 - 99 mg/dL   BUN 8 6 - 20 mg/dL   Creatinine, Ser 4.33 (H) 0.44 - 1.00 mg/dL   Calcium 9.1 8.9 - 29.5 mg/dL   GFR calc non Af Amer >60 >60 mL/min   GFR calc Af Amer >60 >60 mL/min   Anion gap 11 5 - 15  CBC  Result Value Ref Range   WBC 10.2 4.0 - 10.5 K/uL   RBC 4.18 3.87 - 5.11 MIL/uL   Hemoglobin 13.2 12.0 - 15.0 g/dL   HCT 18.8 41.6 - 60.6 %   MCV 96.2 78.0 - 100.0 fL   MCH 31.6 26.0 - 34.0 pg   MCHC 32.8 30.0 - 36.0 g/dL   RDW 30.1 60.1 - 09.3 %   Platelets 237 150 - 400 K/uL  I-stat troponin, ED  Result Value Ref Range   Troponin i,  poc 0.00 0.00 - 0.08 ng/mL   Comment 3           Dg Chest 2 View  Result Date: 09/16/2016 CLINICAL DATA:  Pt states having an on and off cough for the last year. Not taking HTN meds. Not diabetic. Nonsmoker. No hx of asthma. Pt has anxiety attacks. Pt states SOB at times with cough. Pt seems to have some level of an altered mental status today. EXAM: CHEST  2 VIEW COMPARISON:  None. FINDINGS: Cardiomediastinal silhouette is normal in size and configuration. Lungs are clear. Lung volumes are normal. No evidence of pneumonia. No pleural effusion. No pneumothorax. Osseous and soft tissue structures about the chest are unremarkable. IMPRESSION: Normal chest x-ray.  No evidence of pneumonia or pulmonary edema. Electronically Signed   By: Bary RichardStan  Maynard M.D.   On: 09/16/2016 16:48    Initial Impression / Assessment and Plan / ED Course  I have reviewed the triage vital signs and the nursing notes.  Pertinent labs & imaging results that were available during my care of the patient were reviewed by me and considered in my medical decision making (see chart for details).  Clinical Course     5:05 PM Patient somewhat improved after treatment with albuterol nebulized treatment. Continuous nebulization ordered. Patient signed out to Dr Eudelia Bunchardama 5:55 PM. Pretest clinical suspicion of  pulmonary embolism is low d-dimer ordered as patient has resting tachycardia. I suspect that with productive cough she has bronchitis  Final Clinical Impressions(s) / ED Diagnoses  Diagnosis #1 acute dyspnea #2 hypokalemia Final diagnoses:  None  #3 hyperglycemia #4 atypical chest pain  New Prescriptions New Prescriptions   No medications on file     Doug SouSam Soleia Badolato, MD 09/16/16 1817    Doug SouSam Cing , MD 09/16/16 1819

## 2016-09-16 NOTE — ED Provider Notes (Signed)
I assumed care of this patient from Dr. Ethelda ChickJacubowitz at 1800.  Please see their note for further details of Hx, PE.  Briefly patient is a 50 y.o. female with a Shortness of Breath  Noted to have oxygen requirement. Patient provided with several doses of breathing treatments and also given steroids. D-dimer positive however CT PE study negative. No evidence of pneumonia on CT. Patient's symptoms significantly improved following breathing treatments.   The patient is safe for discharge with strict return precautions.  Disposition: Discharge  Condition: Good  I have discussed the results, Dx and Tx plan with the patient who expressed understanding and agree(s) with the plan. Discharge instructions discussed at great length. The patient was given strict return precautions who verbalized understanding of the instructions. No further questions at time of discharge.   [facility] [address] [place] [id number] [phone] Call  For help establishing care with a care provider       Nira ConnPedro Eduardo Jodeci Rini, MD 09/17/16 [phone]

## 2016-09-16 NOTE — ED Notes (Signed)
Pt ambulatory to the bathroom with no complaints Audible wheezing still noted EDP Cardama notified

## 2016-09-16 NOTE — ED Triage Notes (Signed)
Delay in completing triage related to obtaining EKG and starting treatment for active abnormal respiratory sounds.   With triage pt initial complaint of SOB onset last night. With questioning pt adds intermittent left chest sharpness worsening over past few weeks. Pt abdomen noted to be distended.

## 2016-10-19 DIAGNOSIS — E0500 Thyrotoxicosis with diffuse goiter without thyrotoxic crisis or storm: Secondary | ICD-10-CM | POA: Diagnosis not present

## 2016-11-22 DIAGNOSIS — E0500 Thyrotoxicosis with diffuse goiter without thyrotoxic crisis or storm: Secondary | ICD-10-CM | POA: Diagnosis not present

## 2016-11-28 DIAGNOSIS — J209 Acute bronchitis, unspecified: Secondary | ICD-10-CM | POA: Diagnosis not present

## 2017-01-24 DIAGNOSIS — Z5181 Encounter for therapeutic drug level monitoring: Secondary | ICD-10-CM | POA: Diagnosis not present

## 2017-01-24 DIAGNOSIS — Z79899 Other long term (current) drug therapy: Secondary | ICD-10-CM | POA: Diagnosis not present

## 2017-01-24 DIAGNOSIS — E0590 Thyrotoxicosis, unspecified without thyrotoxic crisis or storm: Secondary | ICD-10-CM | POA: Diagnosis not present

## 2017-01-24 DIAGNOSIS — E0500 Thyrotoxicosis with diffuse goiter without thyrotoxic crisis or storm: Secondary | ICD-10-CM | POA: Diagnosis not present

## 2017-02-08 DIAGNOSIS — F250 Schizoaffective disorder, bipolar type: Secondary | ICD-10-CM | POA: Diagnosis not present

## 2017-02-28 DIAGNOSIS — E0500 Thyrotoxicosis with diffuse goiter without thyrotoxic crisis or storm: Secondary | ICD-10-CM | POA: Diagnosis not present

## 2017-03-09 ENCOUNTER — Other Ambulatory Visit: Payer: Self-pay | Admitting: Obstetrics & Gynecology

## 2017-03-09 DIAGNOSIS — Z1231 Encounter for screening mammogram for malignant neoplasm of breast: Secondary | ICD-10-CM

## 2017-03-16 DIAGNOSIS — Z01419 Encounter for gynecological examination (general) (routine) without abnormal findings: Secondary | ICD-10-CM | POA: Diagnosis not present

## 2017-03-24 ENCOUNTER — Ambulatory Visit: Payer: Medicare Other

## 2017-03-28 ENCOUNTER — Ambulatory Visit
Admission: RE | Admit: 2017-03-28 | Discharge: 2017-03-28 | Disposition: A | Discharge status: Home / Self Care | Payer: Medicare Other | Source: Ambulatory Visit | Attending: Obstetrics & Gynecology | Admitting: Obstetrics & Gynecology

## 2017-03-28 DIAGNOSIS — Z1231 Encounter for screening mammogram for malignant neoplasm of breast: Secondary | ICD-10-CM | POA: Diagnosis not present

## 2017-04-03 DIAGNOSIS — F319 Bipolar disorder, unspecified: Secondary | ICD-10-CM | POA: Diagnosis not present

## 2017-04-03 DIAGNOSIS — E0500 Thyrotoxicosis with diffuse goiter without thyrotoxic crisis or storm: Secondary | ICD-10-CM | POA: Diagnosis not present

## 2017-04-03 DIAGNOSIS — E785 Hyperlipidemia, unspecified: Secondary | ICD-10-CM | POA: Diagnosis not present

## 2017-05-02 DIAGNOSIS — Z5181 Encounter for therapeutic drug level monitoring: Secondary | ICD-10-CM | POA: Diagnosis not present

## 2017-05-02 DIAGNOSIS — E0500 Thyrotoxicosis with diffuse goiter without thyrotoxic crisis or storm: Secondary | ICD-10-CM | POA: Diagnosis not present

## 2017-06-07 DIAGNOSIS — E0500 Thyrotoxicosis with diffuse goiter without thyrotoxic crisis or storm: Secondary | ICD-10-CM | POA: Diagnosis not present

## 2017-07-27 DIAGNOSIS — Z5181 Encounter for therapeutic drug level monitoring: Secondary | ICD-10-CM | POA: Diagnosis not present

## 2017-07-27 DIAGNOSIS — E0590 Thyrotoxicosis, unspecified without thyrotoxic crisis or storm: Secondary | ICD-10-CM | POA: Diagnosis not present

## 2017-07-27 DIAGNOSIS — E0500 Thyrotoxicosis with diffuse goiter without thyrotoxic crisis or storm: Secondary | ICD-10-CM | POA: Diagnosis not present

## 2017-07-27 DIAGNOSIS — Z23 Encounter for immunization: Secondary | ICD-10-CM | POA: Diagnosis not present

## 2017-07-27 DIAGNOSIS — Z79899 Other long term (current) drug therapy: Secondary | ICD-10-CM | POA: Diagnosis not present

## 2017-08-08 DIAGNOSIS — F250 Schizoaffective disorder, bipolar type: Secondary | ICD-10-CM | POA: Diagnosis not present

## 2017-09-08 DIAGNOSIS — E0500 Thyrotoxicosis with diffuse goiter without thyrotoxic crisis or storm: Secondary | ICD-10-CM | POA: Diagnosis not present

## 2017-10-13 DIAGNOSIS — E785 Hyperlipidemia, unspecified: Secondary | ICD-10-CM | POA: Diagnosis not present

## 2017-10-13 DIAGNOSIS — E0500 Thyrotoxicosis with diffuse goiter without thyrotoxic crisis or storm: Secondary | ICD-10-CM | POA: Diagnosis not present

## 2017-10-31 DIAGNOSIS — E785 Hyperlipidemia, unspecified: Secondary | ICD-10-CM | POA: Diagnosis not present

## 2017-10-31 DIAGNOSIS — E0500 Thyrotoxicosis with diffuse goiter without thyrotoxic crisis or storm: Secondary | ICD-10-CM | POA: Diagnosis not present

## 2017-12-08 DIAGNOSIS — E0500 Thyrotoxicosis with diffuse goiter without thyrotoxic crisis or storm: Secondary | ICD-10-CM | POA: Diagnosis not present

## 2017-12-15 DIAGNOSIS — R0989 Other specified symptoms and signs involving the circulatory and respiratory systems: Secondary | ICD-10-CM | POA: Diagnosis not present

## 2017-12-15 DIAGNOSIS — E785 Hyperlipidemia, unspecified: Secondary | ICD-10-CM | POA: Diagnosis not present

## 2017-12-15 DIAGNOSIS — R062 Wheezing: Secondary | ICD-10-CM | POA: Diagnosis not present

## 2017-12-15 DIAGNOSIS — I10 Essential (primary) hypertension: Secondary | ICD-10-CM | POA: Diagnosis not present

## 2017-12-15 DIAGNOSIS — F319 Bipolar disorder, unspecified: Secondary | ICD-10-CM | POA: Diagnosis not present

## 2017-12-29 DIAGNOSIS — E785 Hyperlipidemia, unspecified: Secondary | ICD-10-CM | POA: Diagnosis not present

## 2017-12-29 DIAGNOSIS — R0989 Other specified symptoms and signs involving the circulatory and respiratory systems: Secondary | ICD-10-CM | POA: Diagnosis not present

## 2017-12-29 DIAGNOSIS — F319 Bipolar disorder, unspecified: Secondary | ICD-10-CM | POA: Diagnosis not present

## 2017-12-29 DIAGNOSIS — R062 Wheezing: Secondary | ICD-10-CM | POA: Diagnosis not present

## 2017-12-29 DIAGNOSIS — I10 Essential (primary) hypertension: Secondary | ICD-10-CM | POA: Diagnosis not present

## 2018-01-15 DIAGNOSIS — E0500 Thyrotoxicosis with diffuse goiter without thyrotoxic crisis or storm: Secondary | ICD-10-CM | POA: Diagnosis not present

## 2018-01-25 DIAGNOSIS — F250 Schizoaffective disorder, bipolar type: Secondary | ICD-10-CM | POA: Diagnosis not present

## 2018-01-30 DIAGNOSIS — E0590 Thyrotoxicosis, unspecified without thyrotoxic crisis or storm: Secondary | ICD-10-CM | POA: Diagnosis not present

## 2018-01-30 DIAGNOSIS — Z5181 Encounter for therapeutic drug level monitoring: Secondary | ICD-10-CM | POA: Diagnosis not present

## 2018-01-30 DIAGNOSIS — M25562 Pain in left knee: Secondary | ICD-10-CM | POA: Diagnosis not present

## 2018-01-30 DIAGNOSIS — E0500 Thyrotoxicosis with diffuse goiter without thyrotoxic crisis or storm: Secondary | ICD-10-CM | POA: Diagnosis not present

## 2018-03-13 DIAGNOSIS — J0100 Acute maxillary sinusitis, unspecified: Secondary | ICD-10-CM | POA: Diagnosis not present

## 2018-03-13 DIAGNOSIS — J309 Allergic rhinitis, unspecified: Secondary | ICD-10-CM | POA: Diagnosis not present

## 2018-03-30 ENCOUNTER — Encounter (HOSPITAL_COMMUNITY): Payer: Self-pay | Admitting: Emergency Medicine

## 2018-03-30 ENCOUNTER — Emergency Department (HOSPITAL_COMMUNITY): Payer: 59

## 2018-03-30 ENCOUNTER — Other Ambulatory Visit: Payer: Self-pay

## 2018-03-30 ENCOUNTER — Emergency Department (HOSPITAL_COMMUNITY)
Admission: EM | Admit: 2018-03-30 | Discharge: 2018-03-31 | Disposition: A | Discharge status: Home / Self Care | Payer: 59 | Attending: Emergency Medicine | Admitting: Emergency Medicine

## 2018-03-30 DIAGNOSIS — R05 Cough: Secondary | ICD-10-CM | POA: Diagnosis not present

## 2018-03-30 DIAGNOSIS — R062 Wheezing: Secondary | ICD-10-CM | POA: Diagnosis not present

## 2018-03-30 DIAGNOSIS — R071 Chest pain on breathing: Secondary | ICD-10-CM | POA: Diagnosis not present

## 2018-03-30 DIAGNOSIS — Z7982 Long term (current) use of aspirin: Secondary | ICD-10-CM | POA: Diagnosis not present

## 2018-03-30 DIAGNOSIS — E039 Hypothyroidism, unspecified: Secondary | ICD-10-CM | POA: Insufficient documentation

## 2018-03-30 DIAGNOSIS — I10 Essential (primary) hypertension: Secondary | ICD-10-CM | POA: Insufficient documentation

## 2018-03-30 DIAGNOSIS — R079 Chest pain, unspecified: Secondary | ICD-10-CM | POA: Diagnosis not present

## 2018-03-30 DIAGNOSIS — Z79899 Other long term (current) drug therapy: Secondary | ICD-10-CM | POA: Diagnosis not present

## 2018-03-30 DIAGNOSIS — R059 Cough, unspecified: Secondary | ICD-10-CM

## 2018-03-30 MED ORDER — IPRATROPIUM BROMIDE 0.02 % IN SOLN
0.5000 mg | Freq: Once | RESPIRATORY_TRACT | Status: AC
  Administered 2018-03-30: 0.5 mg via RESPIRATORY_TRACT
  Filled 2018-03-30: qty 2.5

## 2018-03-30 MED ORDER — ALBUTEROL SULFATE (2.5 MG/3ML) 0.083% IN NEBU
5.0000 mg | INHALATION_SOLUTION | Freq: Once | RESPIRATORY_TRACT | Status: AC
  Administered 2018-03-30: 5 mg via RESPIRATORY_TRACT
  Filled 2018-03-30: qty 6

## 2018-03-30 MED ORDER — PREDNISONE 20 MG PO TABS
60.0000 mg | ORAL_TABLET | Freq: Once | ORAL | Status: AC
  Administered 2018-03-30: 60 mg via ORAL
  Filled 2018-03-30: qty 3

## 2018-03-30 NOTE — ED Provider Notes (Signed)
[facility]-EMERGENCY DEPT Provider Note   CSN: [id number] Arrival date & time: 03/30/18  2241     History   Chief Complaint Chief Complaint  Patient presents with  . Wheezing  . Chest Pain    HPI Deborah Moore is a 52 y.o. female with a hx of anxiety, HTN, hypothyroidism presents to the Emergency Department complaining of gradual, persistent, progressively worsening cough onset 4 weeks ago. Associated symptoms include wheezing onset 3 days ago.  Pt reports anterior chest pain described as burning only when she coughs. She denies chest pain with exertion or at rest.  Pt reports her cough is productive and she has yellow drainage from her nose.  Nothing seems to make her symptoms better or worse.  Pt reports when this has happened in the past she has received and inhaler which helped but she did not get one this time.  Pt reports she was given cough syrup, nasal spray and amoxicillin when she saw her PCP about this.  Prescription bottles in hand have a date of 03/13/18.  Pt denies fever, chills, headache, neck pain, abd pain, N/V/D, weakness, dizziness, syncope. Pt reports she is not a smoker.     The history is provided by the patient and medical records. No language interpreter was used.    Past Medical History:  Diagnosis Date  . Anxiety   . Headache    migraines  . Hypertension   . Hypothyroidism     There are no active problems to display for this patient.   Past Surgical History:  Procedure Laterality Date  . COLONOSCOPY WITH PROPOFOL N/A 01/26/2016   Procedure: COLONOSCOPY WITH PROPOFOL;  Surgeon: Charolett Bumpers, MD;  Location: [facility] END[facility]COPY;  Service: Endoscopy;  Laterality: N/A;  . NO PAST SURGERIES       OB History   None      Home Medications    Prior to Admission medications   Medication Sig Start Date End Date Taking? Authorizing Provider  aspirin EC 81 MG tablet Take 81 mg by mouth daily.    Yes [provider]  atenolol  (TENORMIN) 25 MG tablet Take 25 mg by mouth daily with breakfast.  12/30/15  Yes [provider]  azelastine (ASTELIN) 0.1 % nasal spray Place 2 sprays into both nostrils 2 (two) times daily. Use in each nostril as directed   Yes [provider]  clonazePAM (KLONOPIN) 1 MG tablet Take 1 mg by mouth 2 (two) times daily as needed for anxiety.  01/15/16  Yes [provider]  haloperidol (HALDOL) 5 MG tablet Take 5 mg by mouth at bedtime. 12/22/15  Yes [provider]  ipratropium (ATROVENT) 0.06 % nasal spray USE 2 SPRAYS INTO EACH NOSTRIL 4 TIMES A DAY 03/13/18  Yes [provider]  methimazole (TAPAZOLE) 10 MG tablet Take 10 mg by mouth daily with breakfast.  12/18/15  Yes [provider]  Multiple Vitamin (MULTIVITAMIN WITH MINERALS) TABS tablet Take 1 tablet by mouth daily. *Solgar*   Yes [provider]  sertraline (ZOLOFT) 100 MG tablet Take 100 mg by mouth every morning. 12/30/15  Yes [provider]  simvastatin (ZOCOR) 20 MG tablet Take 20 mg by mouth daily.  12/29/15  Yes [provider]  topiramate (TOPAMAX) 50 MG tablet Take 50 mg by mouth 2 (two) times daily. 12/22/15  Yes [provider]  traZODone (DESYREL) 100 MG tablet Take 100 mg by mouth at bedtime. 01/25/18  Yes [provider]  doxycycline (VIBRAMYCIN) 100 MG capsule Take 1 capsule (100 mg total) by mouth 2 (two) times daily. 03/31/18   Hisayo Delossantos, Dahlia ClientHannah, PA-C  predniSONE (DELTASONE) 20 MG tablet Take 2 tablets (40 mg total) by mouth daily. 03/31/18   Ronnell Makarewicz, Boyd KerbsHannah, PA-C    Family History History reviewed. No pertinent family history.  Social History Social History   Tobacco Use  . Smoking status: Never Smoker  . Smokeless tobacco: Never Used  Substance Use Topics  . Alcohol use: No  . Drug use: No     Allergies   Other   Review of Systems Review of Systems  Constitutional: Negative for appetite change, diaphoresis,  fatigue, fever and unexpected weight change.  HENT: Negative for mouth sores.   Eyes: Negative for visual disturbance.  Respiratory: Positive for cough, chest tightness and wheezing. Negative for shortness of breath.   Cardiovascular: Positive for chest pain ( With cough).  Gastrointestinal: Negative for abdominal pain, constipation, diarrhea, nausea and vomiting.  Endocrine: Negative for polydipsia, polyphagia and polyuria.  Genitourinary: Negative for dysuria, frequency, hematuria and urgency.  Musculoskeletal: Negative for back pain and neck stiffness.  Skin: Negative for rash.  Allergic/Immunologic: Negative for immunocompromised state.  Neurological: Negative for syncope, light-headedness and headaches.  Hematological: Does not bruise/bleed easily.  Psychiatric/Behavioral: Negative for sleep disturbance. The patient is not nervous/anxious.      Physical Exam Updated Vital Signs BP 119/72 (BP Location: Left Arm)   Pulse (!) 101   Temp 98.4 F (36.9 C) (Oral)   Resp 18   Ht 5' (1.524 m)   Wt 90.7 kg (200 lb)   SpO2 96%   BMI 39.06 kg/m   Physical Exam  Constitutional: She appears well-developed and well-nourished. No distress.  HENT:  Head: Normocephalic and atraumatic.  Right Ear: Tympanic membrane, external ear and ear canal normal.  Left Ear: Tympanic membrane, external ear and ear canal normal.  Nose: Mucosal edema present. No rhinorrhea. No epistaxis. Right sinus exhibits no maxillary sinus tenderness and no frontal sinus tenderness. Left sinus exhibits no maxillary sinus tenderness and no frontal sinus tenderness.  Mouth/Throat: Uvula is midline and mucous membranes are normal. Mucous membranes are not pale and not cyanotic. No oropharyngeal exudate, posterior oropharyngeal edema, posterior oropharyngeal erythema or tonsillar abscesses.  Eyes: Pupils are equal, round, and reactive to light. Conjunctivae are normal.  Neck: Normal range of motion and full passive range  of motion without pain.  Cardiovascular: Normal rate and intact distal pulses.  Pulmonary/Chest: Effort normal. No accessory muscle usage or stridor. No tachypnea. No respiratory distress. She has decreased breath sounds. She has wheezes. She has rhonchi.  Wheezing and rhonchi throughout  Abdominal: Soft. There is no tenderness.  Musculoskeletal: Normal range of motion.  Lymphadenopathy:    She has no cervical adenopathy.  Neurological: She is alert.  Skin: Skin is warm and dry. No rash noted. She is not diaphoretic.  Psychiatric: She has a normal mood and affect.  Nursing note and vitals reviewed.    ED Treatments / Results  Labs (all labs ordered are listed, but only abnormal results are displayed) Labs Reviewed  BASIC METABOLIC PANEL - Abnormal; Notable for the following components:      Result Value   Glucose, Bld 103 (*)    Creatinine, Ser 1.16 (*)    Calcium 8.8 (*)    GFR calc non Af Amer 53 (*)    All other components within normal limits  CBC  I-STAT TROPONIN,  ED    EKG EKG Interpretation  Date/Time:  Friday March 30 2018 23:02:20 EDT Ventricular Rate:  102 PR Interval:    QRS Duration: 78 QT Interval:  352 QTC Calculation: 459 R Axis:   61 Text Interpretation:  Sinus tachycardia Abnormal R-wave progression, early transition Borderline T abnormalities, anterior leads Confirmed by Benjiman Core [phone]) on 03/31/2018 2:01:52 AM   Radiology Dg Chest 2 View  Result Date: 03/30/2018 CLINICAL DATA:  Initial evaluation for acute mid chest pain, cough, wheezing. EXAM: CHEST - 2 VIEW COMPARISON:  Prior radiograph from 12/15/2017. FINDINGS: The cardiac and mediastinal silhouettes are stable in size and contour, and remain within normal limits. The lungs are normally inflated. No focal infiltrates. Minimal blunting of the left costophrenic angle, suggesting a trace left pleural effusion. No pulmonary edema. No focal infiltrates. No pneumothorax. No acute osseous  abnormality. No acute osseous abnormality identified. IMPRESSION: 1. Minimal blunting of the left costophrenic angle, suggesting trace left pleural effusion. 2. No other active cardiopulmonary disease. Electronically Signed   By: Rise Mu M.D.   On: 03/30/2018 23:25    Procedures Procedures (including critical care time)  Medications Ordered in ED Medications  albuterol (PROVENTIL) (2.5 MG/3ML) 0.083% nebulizer solution 5 mg (5 mg Nebulization Given 03/30/18 2357)  ipratropium (ATROVENT) nebulizer solution 0.5 mg (0.5 mg Nebulization Given 03/30/18 2357)  predniSONE (DELTASONE) tablet 60 mg (60 mg Oral Given 03/30/18 2345)  AEROCHAMBER PLUS FLO-VU MEDIUM MISC 1 each (1 each Other Given 03/31/18 0223)     Initial Impression / Assessment and Plan / ED Course  I have reviewed the triage vital signs and the nursing notes.  Pertinent labs & imaging results that were available during my care of the patient were reviewed by me and considered in my medical decision making (see chart for details).  Clinical Course as of Apr 01 603  Sat Mar 31, 2018  0159 Tachycardic on arrival.  No tachycardia on my clinical exam.  Pulse Rate(!): 101 [HM]  0159 No leukocytosis  WBC: 8.1 [HM]  0200 Troponin negative  Troponin i, poc: 0.00 [HM]  0200 Chest x-ray shows blunting of the left costophrenic angle and likely pleural effusion.  I personally evaluated these images.  DG Chest 2 View [HM]  0200 Nonischemic.  EKG 12-Lead [HM]    Clinical Course User Index [HM] Naeema Patlan, Boyd Kerbs    Patient presents with chest pain only with cough.  She was tachycardic on arrival however this resolved quickly without intervention.  Patient does have some wheezing on exam.  Her troponin is negative.  Chest x-ray shows blunting of the left costophrenic angle that is likely pleural effusion however with her persistent symptoms concern for possible latent pneumonia.  Will give doxycycline.  Patient is not a  smoker.  She has no hypoxia here in the emergency department.  No evidence of sepsis.  Breath sounds improved significantly after albuterol.  She will be discharged home with albuterol MDI.  Discussed importance of close follow-up with her primary care provider.  Patient states understanding and is in agreement with this plan.  Final Clinical Impressions(s) / ED Diagnoses   Final diagnoses:  Chest pain on breathing  Wheezing  Cough    ED Discharge Orders        Ordered    predniSONE (DELTASONE) 20 MG tablet  Daily     03/31/18 0151    doxycycline (VIBRAMYCIN) 100 MG capsule  2 times daily     03/31/18 0151  Shubham Thackston, Boyd Kerbs 03/31/18 1610    Benjiman Core, MD 03/31/18 [phone]

## 2018-03-30 NOTE — ED Triage Notes (Signed)
Patient is complaining of mid chest pain, coughing, wheezing, and mucous. Patient states this started two month ago.

## 2018-03-31 DIAGNOSIS — R071 Chest pain on breathing: Secondary | ICD-10-CM | POA: Diagnosis not present

## 2018-03-31 LAB — BASIC METABOLIC PANEL
ANION GAP: 7 (ref 5–15)
BUN: 9 mg/dL (ref 6–20)
CHLORIDE: 110 mmol/L (ref 101–111)
CO2: 24 mmol/L (ref 22–32)
CREATININE: 1.16 mg/dL — AB (ref 0.44–1.00)
Calcium: 8.8 mg/dL — ABNORMAL LOW (ref 8.9–10.3)
GFR calc non Af Amer: 53 mL/min — ABNORMAL LOW (ref >60)
Glucose, Bld: 103 mg/dL — ABNORMAL HIGH (ref 65–99)
POTASSIUM: 3.8 mmol/L (ref 3.5–5.1)
Sodium: 141 mmol/L (ref 135–145)

## 2018-03-31 LAB — CBC
HEMATOCRIT: 37.9 % (ref 36.0–46.0)
HEMOGLOBIN: 12.5 g/dL (ref 12.0–15.0)
MCH: 31.6 pg (ref 26.0–34.0)
MCHC: 33 g/dL (ref 30.0–36.0)
MCV: 95.7 fL (ref 78.0–100.0)
Platelets: 242 10*3/uL (ref 150–400)
RBC: 3.96 MIL/uL (ref 3.87–5.11)
RDW: 14.6 % (ref 11.5–15.5)
WBC: 8.1 10*3/uL (ref 4.0–10.5)

## 2018-03-31 LAB — I-STAT TROPONIN, ED: Troponin i, poc: 0 ng/mL (ref 0.00–0.08)

## 2018-03-31 MED ORDER — AEROCHAMBER PLUS FLO-VU MEDIUM MISC
1.0000 | Freq: Once | Status: AC
  Administered 2018-03-31: 1
  Filled 2018-03-31: qty 1

## 2018-03-31 MED ORDER — PREDNISONE 20 MG PO TABS: 40.0000 mg | ORAL_TABLET | Freq: Every day | ORAL | 0 refills | Status: DC

## 2018-03-31 MED ORDER — ALBUTEROL SULFATE HFA 108 (90 BASE) MCG/ACT IN AERS
2.0000 | INHALATION_SPRAY | RESPIRATORY_TRACT | Status: DC | PRN
  Administered 2018-03-31: 2 via RESPIRATORY_TRACT
  Filled 2018-03-31: qty 6.7

## 2018-03-31 MED ORDER — DOXYCYCLINE HYCLATE 100 MG PO CAPS: 100.0000 mg | ORAL_CAPSULE | Freq: Two times a day (BID) | ORAL | 0 refills | Status: DC

## 2018-03-31 NOTE — Discharge Instructions (Signed)
1. Medications: albuterol, prednisone, usual home medications °2. Treatment: rest, drink plenty of fluids, begin OTC antihistamine (Zyrtec or Claritin)  °3. Follow Up: Please followup with your primary doctor in 2-3 days for discussion of your diagnoses and further evaluation after today's visit; if you do not have a primary care doctor use the resource guide provided to find one; Please return to the ER for difficulty breathing, high fevers or worsening symptoms. ° °

## 2018-04-04 ENCOUNTER — Other Ambulatory Visit: Payer: Self-pay | Admitting: Obstetrics & Gynecology

## 2018-04-04 DIAGNOSIS — Z1231 Encounter for screening mammogram for malignant neoplasm of breast: Secondary | ICD-10-CM

## 2018-04-13 DIAGNOSIS — E0500 Thyrotoxicosis with diffuse goiter without thyrotoxic crisis or storm: Secondary | ICD-10-CM | POA: Diagnosis not present

## 2018-04-18 DIAGNOSIS — F250 Schizoaffective disorder, bipolar type: Secondary | ICD-10-CM | POA: Diagnosis not present

## 2018-04-19 DIAGNOSIS — E032 Hypothyroidism due to medicaments and other exogenous substances: Secondary | ICD-10-CM | POA: Diagnosis not present

## 2018-04-19 DIAGNOSIS — E785 Hyperlipidemia, unspecified: Secondary | ICD-10-CM | POA: Diagnosis not present

## 2018-04-19 DIAGNOSIS — E0500 Thyrotoxicosis with diffuse goiter without thyrotoxic crisis or storm: Secondary | ICD-10-CM | POA: Diagnosis not present

## 2018-04-19 DIAGNOSIS — I10 Essential (primary) hypertension: Secondary | ICD-10-CM | POA: Diagnosis not present

## 2018-04-19 DIAGNOSIS — R062 Wheezing: Secondary | ICD-10-CM | POA: Diagnosis not present

## 2018-04-24 ENCOUNTER — Ambulatory Visit
Admission: RE | Admit: 2018-04-24 | Discharge: 2018-04-24 | Disposition: A | Discharge status: Home / Self Care | Payer: Medicare Other | Source: Ambulatory Visit | Attending: Obstetrics & Gynecology | Admitting: Obstetrics & Gynecology

## 2018-04-24 DIAGNOSIS — Z1231 Encounter for screening mammogram for malignant neoplasm of breast: Secondary | ICD-10-CM

## 2018-05-18 DIAGNOSIS — J209 Acute bronchitis, unspecified: Secondary | ICD-10-CM | POA: Diagnosis not present

## 2018-06-05 DIAGNOSIS — R062 Wheezing: Secondary | ICD-10-CM | POA: Diagnosis not present

## 2018-06-05 DIAGNOSIS — E032 Hypothyroidism due to medicaments and other exogenous substances: Secondary | ICD-10-CM | POA: Diagnosis not present

## 2018-06-05 DIAGNOSIS — E0500 Thyrotoxicosis with diffuse goiter without thyrotoxic crisis or storm: Secondary | ICD-10-CM | POA: Diagnosis not present

## 2018-06-05 DIAGNOSIS — J4540 Moderate persistent asthma, uncomplicated: Secondary | ICD-10-CM | POA: Diagnosis not present

## 2018-06-12 DIAGNOSIS — R062 Wheezing: Secondary | ICD-10-CM | POA: Diagnosis not present

## 2018-06-12 DIAGNOSIS — E032 Hypothyroidism due to medicaments and other exogenous substances: Secondary | ICD-10-CM | POA: Diagnosis not present

## 2018-06-12 DIAGNOSIS — E0500 Thyrotoxicosis with diffuse goiter without thyrotoxic crisis or storm: Secondary | ICD-10-CM | POA: Diagnosis not present

## 2018-06-12 DIAGNOSIS — J4540 Moderate persistent asthma, uncomplicated: Secondary | ICD-10-CM | POA: Diagnosis not present

## 2018-07-06 DIAGNOSIS — E0500 Thyrotoxicosis with diffuse goiter without thyrotoxic crisis or storm: Secondary | ICD-10-CM | POA: Diagnosis not present

## 2018-08-06 DIAGNOSIS — E0590 Thyrotoxicosis, unspecified without thyrotoxic crisis or storm: Secondary | ICD-10-CM | POA: Diagnosis not present

## 2018-08-06 DIAGNOSIS — E0500 Thyrotoxicosis with diffuse goiter without thyrotoxic crisis or storm: Secondary | ICD-10-CM | POA: Diagnosis not present

## 2018-08-09 DIAGNOSIS — Z5181 Encounter for therapeutic drug level monitoring: Secondary | ICD-10-CM | POA: Diagnosis not present

## 2018-08-09 DIAGNOSIS — Z23 Encounter for immunization: Secondary | ICD-10-CM | POA: Diagnosis not present

## 2018-08-09 DIAGNOSIS — E0590 Thyrotoxicosis, unspecified without thyrotoxic crisis or storm: Secondary | ICD-10-CM | POA: Diagnosis not present

## 2018-08-09 DIAGNOSIS — E0500 Thyrotoxicosis with diffuse goiter without thyrotoxic crisis or storm: Secondary | ICD-10-CM | POA: Diagnosis not present

## 2018-08-14 DIAGNOSIS — E0500 Thyrotoxicosis with diffuse goiter without thyrotoxic crisis or storm: Secondary | ICD-10-CM | POA: Diagnosis not present

## 2018-08-14 DIAGNOSIS — J4540 Moderate persistent asthma, uncomplicated: Secondary | ICD-10-CM | POA: Diagnosis not present

## 2018-09-11 DIAGNOSIS — J0100 Acute maxillary sinusitis, unspecified: Secondary | ICD-10-CM | POA: Diagnosis not present

## 2018-09-17 DIAGNOSIS — E0590 Thyrotoxicosis, unspecified without thyrotoxic crisis or storm: Secondary | ICD-10-CM | POA: Diagnosis not present

## 2018-09-17 DIAGNOSIS — E0500 Thyrotoxicosis with diffuse goiter without thyrotoxic crisis or storm: Secondary | ICD-10-CM | POA: Diagnosis not present

## 2018-09-17 DIAGNOSIS — Z5181 Encounter for therapeutic drug level monitoring: Secondary | ICD-10-CM | POA: Diagnosis not present

## 2018-11-06 DIAGNOSIS — F250 Schizoaffective disorder, bipolar type: Secondary | ICD-10-CM | POA: Diagnosis not present

## 2018-11-26 DIAGNOSIS — Z5181 Encounter for therapeutic drug level monitoring: Secondary | ICD-10-CM | POA: Diagnosis not present

## 2018-11-26 DIAGNOSIS — F319 Bipolar disorder, unspecified: Secondary | ICD-10-CM | POA: Diagnosis not present

## 2018-11-26 DIAGNOSIS — E785 Hyperlipidemia, unspecified: Secondary | ICD-10-CM | POA: Diagnosis not present

## 2018-11-26 DIAGNOSIS — E0590 Thyrotoxicosis, unspecified without thyrotoxic crisis or storm: Secondary | ICD-10-CM | POA: Diagnosis not present

## 2018-11-26 DIAGNOSIS — J4540 Moderate persistent asthma, uncomplicated: Secondary | ICD-10-CM | POA: Diagnosis not present

## 2018-11-26 DIAGNOSIS — J310 Chronic rhinitis: Secondary | ICD-10-CM | POA: Diagnosis not present

## 2018-11-26 DIAGNOSIS — I10 Essential (primary) hypertension: Secondary | ICD-10-CM | POA: Diagnosis not present

## 2019-01-28 DIAGNOSIS — E0500 Thyrotoxicosis with diffuse goiter without thyrotoxic crisis or storm: Secondary | ICD-10-CM | POA: Diagnosis not present

## 2019-01-28 DIAGNOSIS — E785 Hyperlipidemia, unspecified: Secondary | ICD-10-CM | POA: Diagnosis not present

## 2019-01-28 DIAGNOSIS — J4540 Moderate persistent asthma, uncomplicated: Secondary | ICD-10-CM | POA: Diagnosis not present

## 2019-01-28 DIAGNOSIS — E0590 Thyrotoxicosis, unspecified without thyrotoxic crisis or storm: Secondary | ICD-10-CM | POA: Diagnosis not present

## 2019-01-28 DIAGNOSIS — Z5181 Encounter for therapeutic drug level monitoring: Secondary | ICD-10-CM | POA: Diagnosis not present

## 2019-04-04 ENCOUNTER — Other Ambulatory Visit: Payer: Self-pay | Admitting: Obstetrics & Gynecology

## 2019-04-04 DIAGNOSIS — Z9289 Personal history of other medical treatment: Secondary | ICD-10-CM

## 2019-05-02 DIAGNOSIS — Z79899 Other long term (current) drug therapy: Secondary | ICD-10-CM | POA: Diagnosis not present

## 2019-05-02 DIAGNOSIS — Z5181 Encounter for therapeutic drug level monitoring: Secondary | ICD-10-CM | POA: Diagnosis not present

## 2019-05-02 DIAGNOSIS — E0590 Thyrotoxicosis, unspecified without thyrotoxic crisis or storm: Secondary | ICD-10-CM | POA: Diagnosis not present

## 2019-05-02 DIAGNOSIS — E0500 Thyrotoxicosis with diffuse goiter without thyrotoxic crisis or storm: Secondary | ICD-10-CM | POA: Diagnosis not present

## 2019-05-07 DIAGNOSIS — F250 Schizoaffective disorder, bipolar type: Secondary | ICD-10-CM | POA: Diagnosis not present

## 2019-05-15 ENCOUNTER — Ambulatory Visit: Payer: Medicare Other

## 2019-06-13 DIAGNOSIS — Z5181 Encounter for therapeutic drug level monitoring: Secondary | ICD-10-CM | POA: Diagnosis not present

## 2019-06-13 DIAGNOSIS — E0500 Thyrotoxicosis with diffuse goiter without thyrotoxic crisis or storm: Secondary | ICD-10-CM | POA: Diagnosis not present

## 2019-06-13 DIAGNOSIS — E0590 Thyrotoxicosis, unspecified without thyrotoxic crisis or storm: Secondary | ICD-10-CM | POA: Diagnosis not present

## 2019-06-26 ENCOUNTER — Ambulatory Visit
Admission: RE | Admit: 2019-06-26 | Discharge: 2019-06-26 | Disposition: A | Discharge status: Home / Self Care | Payer: Medicare Other | Source: Ambulatory Visit | Attending: Obstetrics & Gynecology | Admitting: Obstetrics & Gynecology

## 2019-06-26 ENCOUNTER — Other Ambulatory Visit: Payer: Self-pay

## 2019-06-26 ENCOUNTER — Other Ambulatory Visit: Payer: Self-pay | Admitting: Obstetrics & Gynecology

## 2019-06-26 DIAGNOSIS — Z9289 Personal history of other medical treatment: Secondary | ICD-10-CM

## 2019-07-25 DIAGNOSIS — E0590 Thyrotoxicosis, unspecified without thyrotoxic crisis or storm: Secondary | ICD-10-CM | POA: Diagnosis not present

## 2019-07-25 DIAGNOSIS — Z23 Encounter for immunization: Secondary | ICD-10-CM | POA: Diagnosis not present

## 2019-08-01 DIAGNOSIS — I10 Essential (primary) hypertension: Secondary | ICD-10-CM | POA: Diagnosis not present

## 2019-08-01 DIAGNOSIS — E0590 Thyrotoxicosis, unspecified without thyrotoxic crisis or storm: Secondary | ICD-10-CM | POA: Diagnosis not present

## 2019-08-01 DIAGNOSIS — Z5181 Encounter for therapeutic drug level monitoring: Secondary | ICD-10-CM | POA: Diagnosis not present

## 2019-08-01 DIAGNOSIS — E032 Hypothyroidism due to medicaments and other exogenous substances: Secondary | ICD-10-CM | POA: Diagnosis not present

## 2019-08-01 DIAGNOSIS — J4540 Moderate persistent asthma, uncomplicated: Secondary | ICD-10-CM | POA: Diagnosis not present

## 2019-08-01 DIAGNOSIS — E0500 Thyrotoxicosis with diffuse goiter without thyrotoxic crisis or storm: Secondary | ICD-10-CM | POA: Diagnosis not present

## 2019-08-01 DIAGNOSIS — E785 Hyperlipidemia, unspecified: Secondary | ICD-10-CM | POA: Diagnosis not present

## 2019-08-26 DIAGNOSIS — E785 Hyperlipidemia, unspecified: Secondary | ICD-10-CM | POA: Diagnosis not present

## 2019-09-18 ENCOUNTER — Other Ambulatory Visit: Payer: Self-pay | Admitting: Internal Medicine

## 2019-09-18 ENCOUNTER — Other Ambulatory Visit (HOSPITAL_COMMUNITY): Payer: Self-pay | Admitting: Internal Medicine

## 2019-09-18 DIAGNOSIS — E0500 Thyrotoxicosis with diffuse goiter without thyrotoxic crisis or storm: Secondary | ICD-10-CM

## 2019-09-18 DIAGNOSIS — E0590 Thyrotoxicosis, unspecified without thyrotoxic crisis or storm: Secondary | ICD-10-CM

## 2019-10-09 ENCOUNTER — Encounter (HOSPITAL_COMMUNITY)
Admission: RE | Admit: 2019-10-09 | Discharge: 2019-10-09 | Disposition: A | Discharge status: Home / Self Care | Payer: 59 | Source: Ambulatory Visit | Attending: Internal Medicine | Admitting: Internal Medicine

## 2019-10-09 ENCOUNTER — Other Ambulatory Visit: Payer: Self-pay

## 2019-10-09 DIAGNOSIS — E0500 Thyrotoxicosis with diffuse goiter without thyrotoxic crisis or storm: Secondary | ICD-10-CM | POA: Diagnosis not present

## 2019-10-09 DIAGNOSIS — E0590 Thyrotoxicosis, unspecified without thyrotoxic crisis or storm: Secondary | ICD-10-CM | POA: Diagnosis present

## 2019-10-09 MED ORDER — SODIUM IODIDE I-123 7.4 MBQ CAPS
424.0000 | ORAL_CAPSULE | Freq: Once | ORAL | Status: AC
  Administered 2019-10-09: 10:00:00 424 via ORAL

## 2019-10-10 ENCOUNTER — Encounter (HOSPITAL_COMMUNITY)
Admission: RE | Admit: 2019-10-10 | Discharge: 2019-10-10 | Disposition: A | Discharge status: Home / Self Care | Payer: 59 | Source: Ambulatory Visit | Attending: Internal Medicine | Admitting: Internal Medicine

## 2019-10-10 DIAGNOSIS — E0500 Thyrotoxicosis with diffuse goiter without thyrotoxic crisis or storm: Secondary | ICD-10-CM | POA: Diagnosis not present

## 2019-10-21 ENCOUNTER — Other Ambulatory Visit (HOSPITAL_COMMUNITY): Payer: Self-pay | Admitting: Internal Medicine

## 2019-10-21 DIAGNOSIS — E0590 Thyrotoxicosis, unspecified without thyrotoxic crisis or storm: Secondary | ICD-10-CM

## 2019-11-01 ENCOUNTER — Other Ambulatory Visit: Payer: Self-pay

## 2019-11-01 ENCOUNTER — Ambulatory Visit (HOSPITAL_COMMUNITY)
Admission: RE | Admit: 2019-11-01 | Discharge: 2019-11-01 | Disposition: A | Discharge status: Home / Self Care | Payer: Medicare Other | Source: Ambulatory Visit | Attending: Internal Medicine | Admitting: Internal Medicine

## 2019-11-01 DIAGNOSIS — E0590 Thyrotoxicosis, unspecified without thyrotoxic crisis or storm: Secondary | ICD-10-CM | POA: Diagnosis not present

## 2019-11-01 MED ORDER — SODIUM IODIDE I 131 CAPSULE
12.7000 | Freq: Once | INTRAVENOUS | Status: AC | PRN
  Administered 2019-11-01: 12.7 via ORAL

## 2019-11-18 DIAGNOSIS — E0590 Thyrotoxicosis, unspecified without thyrotoxic crisis or storm: Secondary | ICD-10-CM | POA: Diagnosis not present

## 2019-11-18 DIAGNOSIS — Z79899 Other long term (current) drug therapy: Secondary | ICD-10-CM | POA: Diagnosis not present

## 2019-11-18 DIAGNOSIS — E0500 Thyrotoxicosis with diffuse goiter without thyrotoxic crisis or storm: Secondary | ICD-10-CM | POA: Diagnosis not present

## 2019-12-06 DIAGNOSIS — E0500 Thyrotoxicosis with diffuse goiter without thyrotoxic crisis or storm: Secondary | ICD-10-CM | POA: Diagnosis not present

## 2019-12-06 DIAGNOSIS — E890 Postprocedural hypothyroidism: Secondary | ICD-10-CM | POA: Diagnosis not present

## 2020-02-21 DIAGNOSIS — Z23 Encounter for immunization: Secondary | ICD-10-CM | POA: Diagnosis not present

## 2020-03-10 DIAGNOSIS — R062 Wheezing: Secondary | ICD-10-CM | POA: Diagnosis not present

## 2020-03-10 DIAGNOSIS — E785 Hyperlipidemia, unspecified: Secondary | ICD-10-CM | POA: Diagnosis not present

## 2020-03-10 DIAGNOSIS — E0500 Thyrotoxicosis with diffuse goiter without thyrotoxic crisis or storm: Secondary | ICD-10-CM | POA: Diagnosis not present

## 2020-03-10 DIAGNOSIS — I10 Essential (primary) hypertension: Secondary | ICD-10-CM | POA: Diagnosis not present

## 2020-03-10 DIAGNOSIS — E890 Postprocedural hypothyroidism: Secondary | ICD-10-CM | POA: Diagnosis not present

## 2020-03-10 DIAGNOSIS — J4540 Moderate persistent asthma, uncomplicated: Secondary | ICD-10-CM | POA: Diagnosis not present

## 2020-03-20 DIAGNOSIS — Z23 Encounter for immunization: Secondary | ICD-10-CM | POA: Diagnosis not present

## 2020-05-27 DIAGNOSIS — F250 Schizoaffective disorder, bipolar type: Secondary | ICD-10-CM | POA: Diagnosis not present

## 2020-05-29 ENCOUNTER — Other Ambulatory Visit: Payer: Self-pay | Admitting: Family Medicine

## 2020-05-29 ENCOUNTER — Other Ambulatory Visit: Payer: Self-pay | Admitting: Obstetrics & Gynecology

## 2020-05-29 DIAGNOSIS — Z1231 Encounter for screening mammogram for malignant neoplasm of breast: Secondary | ICD-10-CM

## 2020-06-08 DIAGNOSIS — Z01419 Encounter for gynecological examination (general) (routine) without abnormal findings: Secondary | ICD-10-CM | POA: Diagnosis not present

## 2020-06-08 DIAGNOSIS — Z6833 Body mass index (BMI) 33.0-33.9, adult: Secondary | ICD-10-CM | POA: Diagnosis not present

## 2020-06-19 IMAGING — MG MM DIGITAL SCREENING BILAT W/ TOMO W/ CAD
8 series · 8 of 24 positions shown · non-contrast
Comparison: Previous exam(s).

CLINICAL DATA: Screening.

EXAM:
DIGITAL SCREENING BILATERAL MAMMOGRAM WITH TOMO AND CAD

[R MLO synth-2D]
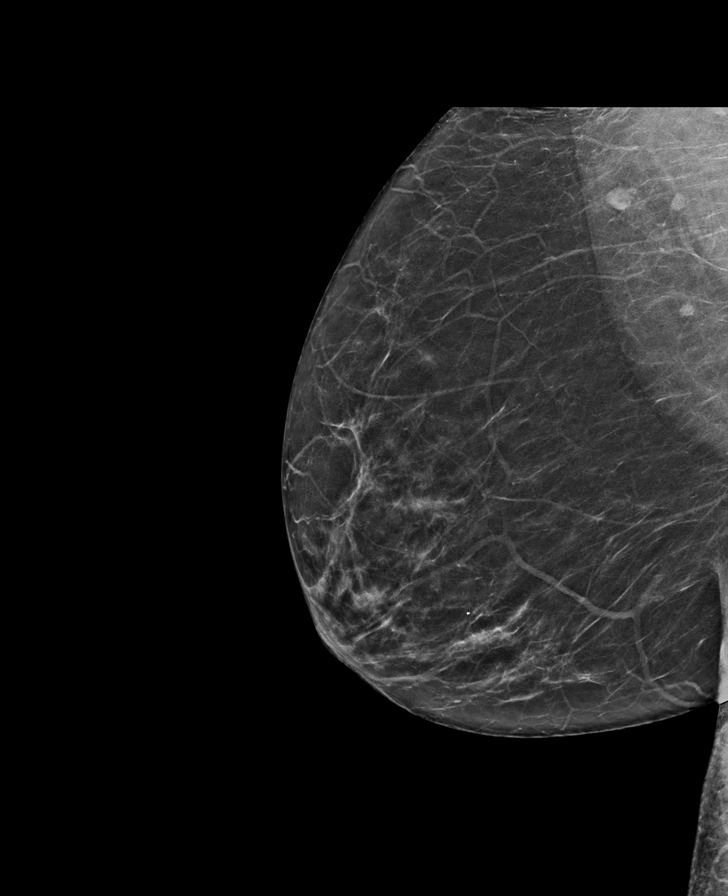

[L CC synth-2D]
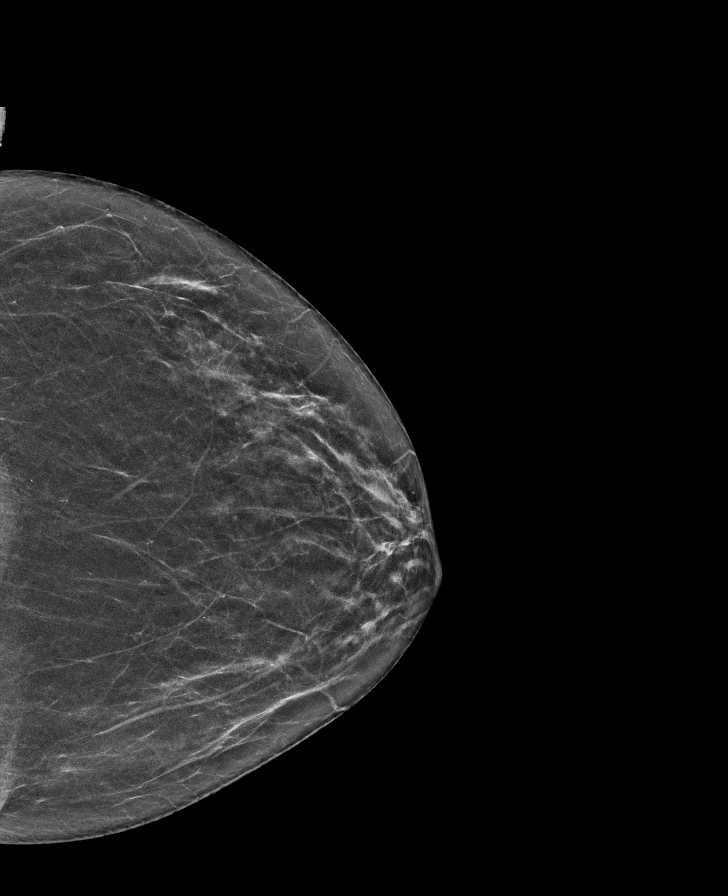

[R CC synth-2D]
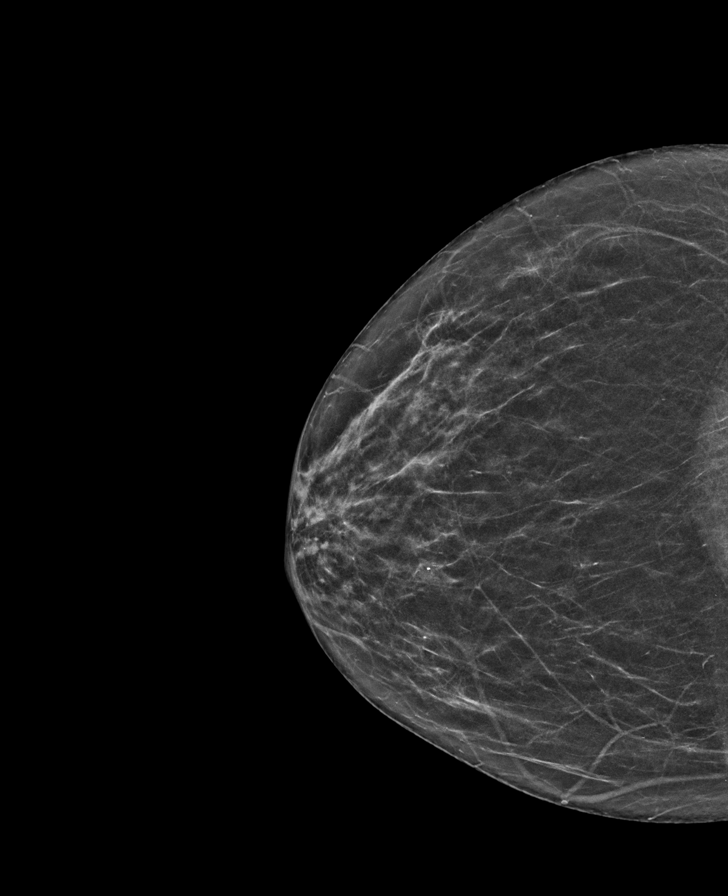

[L MLO synth-2D]
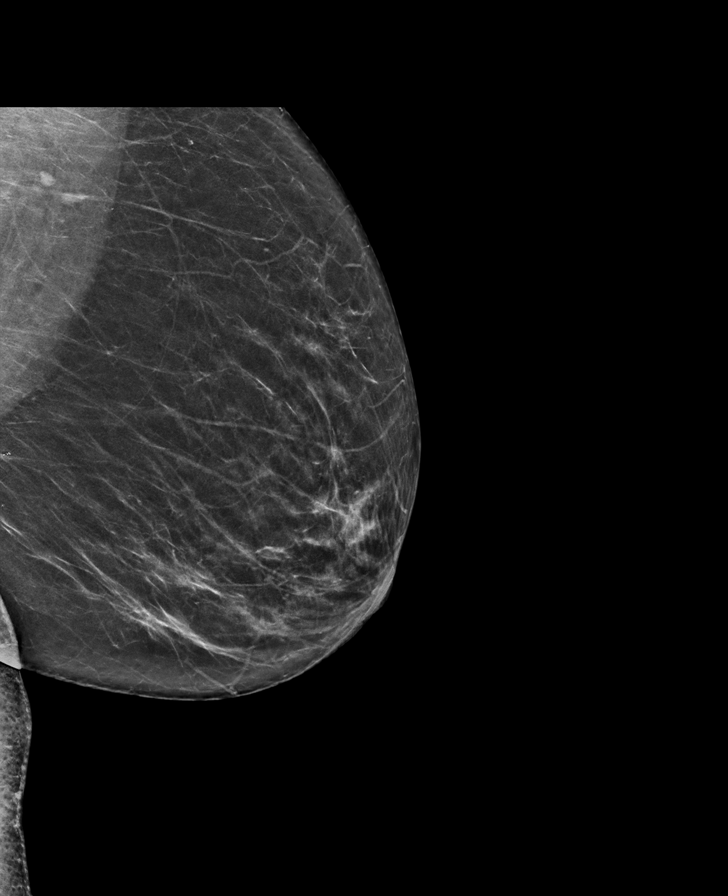

[R CC tomo · tomo slice 29/56.0]
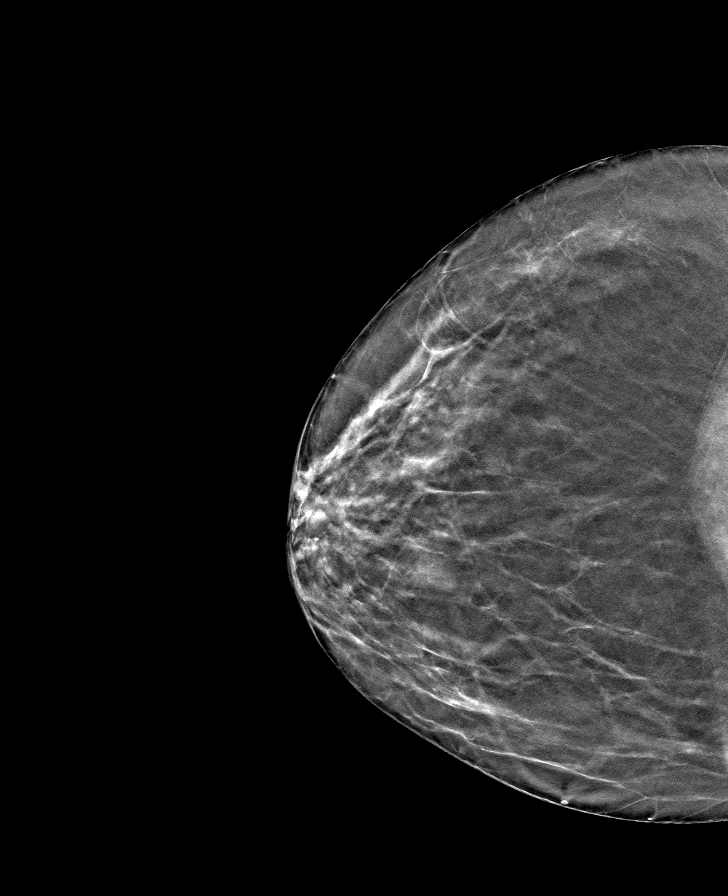

[L MLO tomo · tomo slice 33/65.0]
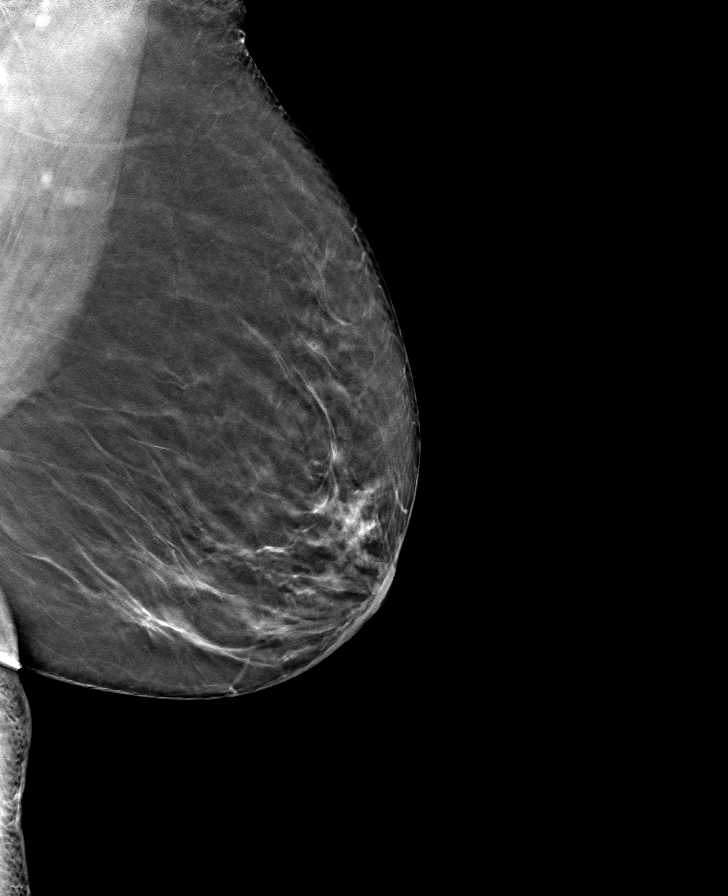

[L CC tomo · tomo slice 29/56.0]
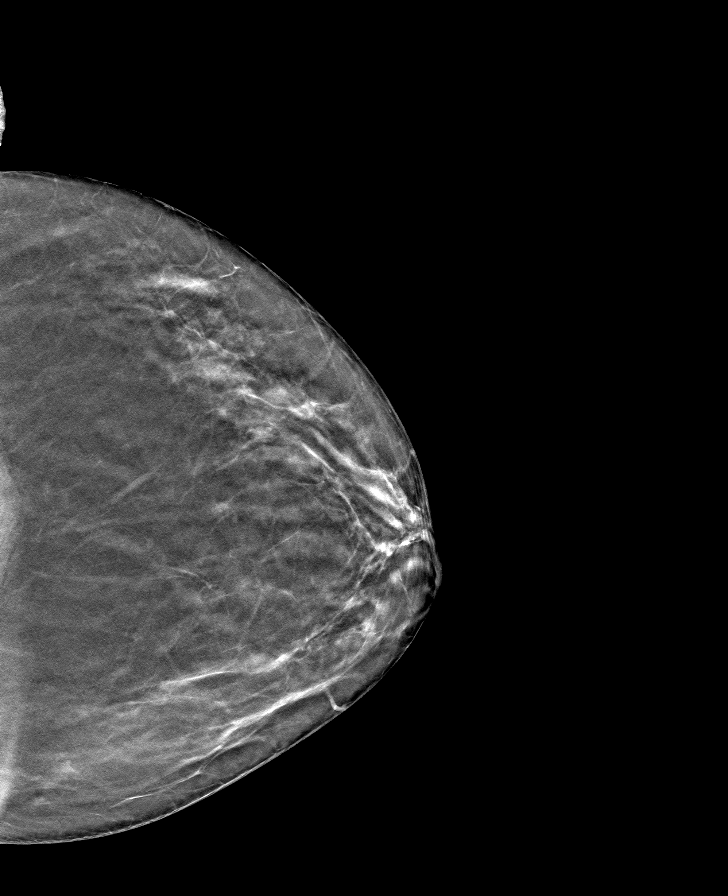

[R MLO tomo · tomo slice 32/63.0]
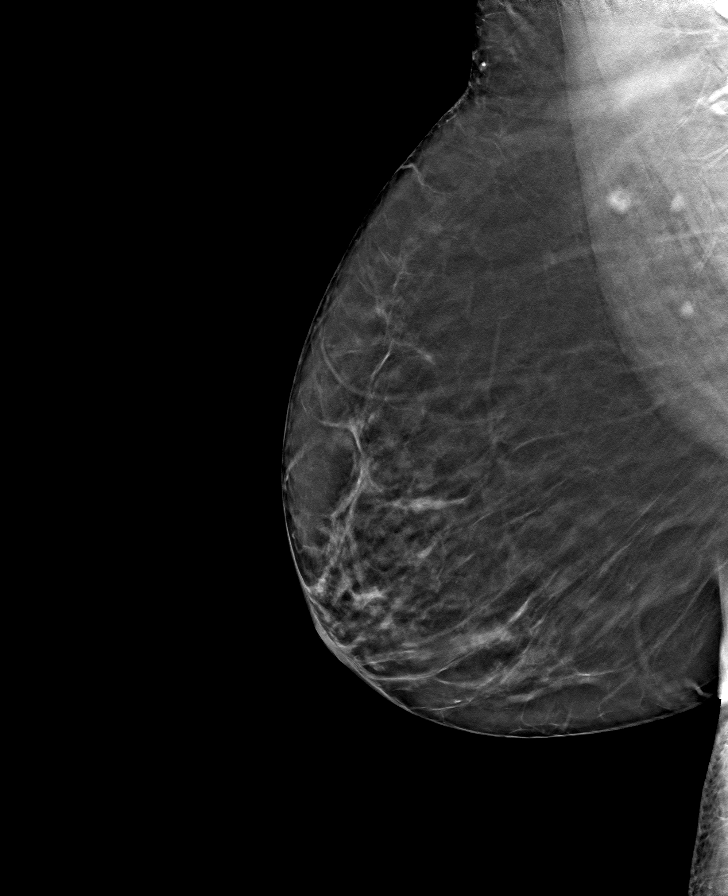

[8 of 24 positions shown; findings below may reference images not displayed]

ACR Breast Density Category b: There are scattered areas of
fibroglandular density.
FINDINGS: There are no findings suspicious for malignancy. Images were
processed with CAD.
IMPRESSION: No mammographic evidence of malignancy. A result letter of this
screening mammogram will be mailed directly to the patient.

RECOMMENDATION:
Screening mammogram in one year. (Code:CN-U-775)

BI-RADS CATEGORY  1: Negative.

## 2020-06-26 ENCOUNTER — Ambulatory Visit: Payer: Medicare Other

## 2020-07-27 ENCOUNTER — Ambulatory Visit: Payer: Medicare Other

## 2020-08-21 DIAGNOSIS — E890 Postprocedural hypothyroidism: Secondary | ICD-10-CM | POA: Diagnosis not present

## 2020-08-21 DIAGNOSIS — E0500 Thyrotoxicosis with diffuse goiter without thyrotoxic crisis or storm: Secondary | ICD-10-CM | POA: Diagnosis not present

## 2020-09-03 ENCOUNTER — Other Ambulatory Visit: Payer: Self-pay

## 2020-09-03 ENCOUNTER — Ambulatory Visit
Admission: RE | Admit: 2020-09-03 | Discharge: 2020-09-03 | Disposition: A | Discharge status: Home / Self Care | Payer: Medicare Other | Source: Ambulatory Visit | Attending: Family Medicine | Admitting: Family Medicine

## 2020-09-03 DIAGNOSIS — Z1231 Encounter for screening mammogram for malignant neoplasm of breast: Secondary | ICD-10-CM

## 2020-11-19 DIAGNOSIS — F411 Generalized anxiety disorder: Secondary | ICD-10-CM | POA: Diagnosis not present

## 2020-11-19 DIAGNOSIS — F200 Paranoid schizophrenia: Secondary | ICD-10-CM | POA: Diagnosis not present

## 2020-11-19 DIAGNOSIS — F201 Disorganized schizophrenia: Secondary | ICD-10-CM | POA: Diagnosis not present

## 2020-11-19 DIAGNOSIS — F5104 Psychophysiologic insomnia: Secondary | ICD-10-CM | POA: Diagnosis not present

## 2021-08-31 DIAGNOSIS — F5104 Psychophysiologic insomnia: Secondary | ICD-10-CM | POA: Diagnosis not present

## 2021-08-31 DIAGNOSIS — F200 Paranoid schizophrenia: Secondary | ICD-10-CM | POA: Diagnosis not present

## 2021-08-31 DIAGNOSIS — F410 Panic disorder [episodic paroxysmal anxiety] without agoraphobia: Secondary | ICD-10-CM | POA: Diagnosis not present

## 2021-08-31 DIAGNOSIS — F201 Disorganized schizophrenia: Secondary | ICD-10-CM | POA: Diagnosis not present

## 2021-08-31 DIAGNOSIS — F411 Generalized anxiety disorder: Secondary | ICD-10-CM | POA: Diagnosis not present

## 2021-09-02 DIAGNOSIS — E079 Disorder of thyroid, unspecified: Secondary | ICD-10-CM | POA: Diagnosis not present

## 2021-09-27 DIAGNOSIS — U071 COVID-19: Secondary | ICD-10-CM | POA: Diagnosis not present

## 2021-10-19 DIAGNOSIS — Z23 Encounter for immunization: Secondary | ICD-10-CM | POA: Diagnosis not present

## 2021-10-29 ENCOUNTER — Other Ambulatory Visit: Payer: Self-pay | Admitting: Family Medicine

## 2021-10-29 DIAGNOSIS — Z1231 Encounter for screening mammogram for malignant neoplasm of breast: Secondary | ICD-10-CM

## 2021-11-03 DIAGNOSIS — F250 Schizoaffective disorder, bipolar type: Secondary | ICD-10-CM | POA: Diagnosis not present

## 2021-11-17 ENCOUNTER — Ambulatory Visit: Payer: Medicare Other

## 2021-11-26 ENCOUNTER — Ambulatory Visit
Admission: RE | Admit: 2021-11-26 | Discharge: 2021-11-26 | Disposition: A | Discharge status: Home / Self Care | Payer: Medicare Other | Source: Ambulatory Visit | Attending: Family Medicine | Admitting: Family Medicine

## 2021-11-26 ENCOUNTER — Other Ambulatory Visit: Payer: Self-pay

## 2021-11-26 DIAGNOSIS — Z1231 Encounter for screening mammogram for malignant neoplasm of breast: Secondary | ICD-10-CM | POA: Diagnosis not present

## 2021-11-29 DIAGNOSIS — E890 Postprocedural hypothyroidism: Secondary | ICD-10-CM | POA: Diagnosis not present

## 2021-11-29 DIAGNOSIS — E0500 Thyrotoxicosis with diffuse goiter without thyrotoxic crisis or storm: Secondary | ICD-10-CM | POA: Diagnosis not present

## 2021-12-20 DIAGNOSIS — E785 Hyperlipidemia, unspecified: Secondary | ICD-10-CM | POA: Diagnosis not present

## 2021-12-20 DIAGNOSIS — I10 Essential (primary) hypertension: Secondary | ICD-10-CM | POA: Diagnosis not present

## 2021-12-20 DIAGNOSIS — E0500 Thyrotoxicosis with diffuse goiter without thyrotoxic crisis or storm: Secondary | ICD-10-CM | POA: Diagnosis not present

## 2021-12-20 DIAGNOSIS — J4540 Moderate persistent asthma, uncomplicated: Secondary | ICD-10-CM | POA: Diagnosis not present

## 2021-12-20 DIAGNOSIS — E890 Postprocedural hypothyroidism: Secondary | ICD-10-CM | POA: Diagnosis not present

## 2022-02-01 DIAGNOSIS — E890 Postprocedural hypothyroidism: Secondary | ICD-10-CM | POA: Diagnosis not present

## 2022-02-15 ENCOUNTER — Encounter (HOSPITAL_COMMUNITY): Payer: Self-pay

## 2022-02-15 ENCOUNTER — Emergency Department (HOSPITAL_COMMUNITY)
Admission: EM | Admit: 2022-02-15 | Discharge: 2022-02-15 | Disposition: A | Discharge status: Home / Self Care | Payer: Medicare Other | Attending: Emergency Medicine | Admitting: Emergency Medicine

## 2022-02-15 ENCOUNTER — Emergency Department (HOSPITAL_COMMUNITY): Payer: Medicare Other

## 2022-02-15 ENCOUNTER — Other Ambulatory Visit: Payer: Self-pay

## 2022-02-15 DIAGNOSIS — I10 Essential (primary) hypertension: Secondary | ICD-10-CM | POA: Diagnosis not present

## 2022-02-15 DIAGNOSIS — R0789 Other chest pain: Secondary | ICD-10-CM | POA: Diagnosis not present

## 2022-02-15 DIAGNOSIS — R079 Chest pain, unspecified: Secondary | ICD-10-CM | POA: Diagnosis not present

## 2022-02-15 DIAGNOSIS — E039 Hypothyroidism, unspecified: Secondary | ICD-10-CM | POA: Insufficient documentation

## 2022-02-15 LAB — BASIC METABOLIC PANEL
Anion gap: 8 (ref 5–15)
BUN: 10 mg/dL (ref 6–20)
CO2: 26 mmol/L (ref 22–32)
Calcium: 9.4 mg/dL (ref 8.9–10.3)
Chloride: 104 mmol/L (ref 98–111)
Creatinine, Ser: 0.98 mg/dL (ref 0.44–1.00)
GFR, Estimated: >60 mL/min (ref >60)
Glucose, Bld: 92 mg/dL (ref 70–99)
Potassium: 3.7 mmol/L (ref 3.5–5.1)
Sodium: 138 mmol/L (ref 135–145)

## 2022-02-15 LAB — TROPONIN I (HIGH SENSITIVITY)
Troponin I (High Sensitivity): 4 ng/L (ref <18)
Troponin I (High Sensitivity): 4 ng/L (ref <18)

## 2022-02-15 LAB — D-DIMER, QUANTITATIVE: D-Dimer, Quant: <0.27 ug/mL-FEU (ref 0.00–0.50)

## 2022-02-15 LAB — CBC
HCT: 38.2 % (ref 36.0–46.0)
Hemoglobin: 12.4 g/dL (ref 12.0–15.0)
MCH: 31.2 pg (ref 26.0–34.0)
MCHC: 32.5 g/dL (ref 30.0–36.0)
MCV: 96.2 fL (ref 80.0–100.0)
Platelets: 176 10*3/uL (ref 150–400)
RBC: 3.97 MIL/uL (ref 3.87–5.11)
RDW: 12.9 % (ref 11.5–15.5)
WBC: 6.8 10*3/uL (ref 4.0–10.5)
nRBC: 0 % (ref 0.0–0.2)

## 2022-02-15 LAB — I-STAT BETA HCG BLOOD, ED (MC, WL, AP ONLY): I-stat hCG, quantitative: <5 m[IU]/mL (ref <5)

## 2022-02-15 NOTE — ED Triage Notes (Signed)
Patient reports that she began having loewr chest pain behind both breasts 2 days ago. Patiaent denies any SOB, diaphoresis, or N/v. ?Patient states, "something might be wrong with my lungs", when asked to explain why patient states, "It's just my own diagnosis. ?

## 2022-02-15 NOTE — ED Provider Notes (Signed)
? ?Emergency Department Provider Note ? ? ?I have reviewed the triage vital signs and the nursing notes. ? ? ?HISTORY ? ?Chief Complaint ?Chest Pain ? ? ?HPI ?Deborah Moore is a 56 y.o. female with PMH of HTN presents to the ED with band-like pain across the chest for the last 2 days. Pain is intermittent with no worsening symptoms with exertion. No nausea or vomiting. No SOB. No diaphoresis. No pleuritic pain. No leg pain or swelling. Patient notes that she did have a fall down the steps 2 weeks prior but no pain immediately afterwards. No abdominal or back pain.  ? ? ?Past Medical History:  ?Diagnosis Date  ? Anxiety   ? Headache   ? migraines  ? Hypertension   ? Hypothyroidism   ? ? ?Review of Systems ? ?Constitutional: No fever/chills ?Eyes: No visual changes. ?ENT: No sore throat. ?Cardiovascular: Positive chest pain. ?Respiratory: Denies shortness of breath. ?Gastrointestinal: No abdominal pain.  No nausea, no vomiting.  No diarrhea.  No constipation. ?Genitourinary: Negative for dysuria. ?Musculoskeletal: Negative for back pain. ?Skin: Negative for rash. ?Neurological: Negative for headaches, focal weakness or numbness. ? ? ?____________________________________________ ? ? ?PHYSICAL EXAM: ? ?VITAL SIGNS: ?ED Triage Vitals  ?Enc Vitals Group  ?   BP 02/15/22 1012 (!) 150/99  ?   Pulse Rate 02/15/22 1012 (!) 58  ?   Resp 02/15/22 1012 18  ?   Temp 02/15/22 1012 97.8 ?F (36.6 ?C)  ?   Temp Source 02/15/22 1012 Oral  ?   SpO2 02/15/22 1012 98 %  ?   Weight 02/15/22 1012 170 lb (77.1 kg)  ?   Height 02/15/22 1012 5\' 1"  (1.549 m)  ? ?Constitutional: Alert and oriented. Well appearing and in no acute distress. ?Eyes: Conjunctivae are normal.  ?Head: Atraumatic. ?Nose: No congestion/rhinnorhea. ?Mouth/Throat: Mucous membranes are moist.   ?Neck: No stridor.  ?Cardiovascular: Normal rate, regular rhythm. Good peripheral circulation. Grossly normal heart sounds.   ?Respiratory: Normal respiratory effort.  No retractions.  Lungs CTAB. ?Gastrointestinal: Soft and nontender. No distention.  ?Musculoskeletal: No lower extremity tenderness nor edema. No gross deformities of extremities. ?Neurologic:  Normal speech and language. No gross focal neurologic deficits are appreciated.  ?Skin:  Skin is warm, dry and intact. No rash noted. ? ?___________________________________________ ?  ?LABS ?(all labs ordered are listed, but only abnormal results are displayed) ? ?Labs Reviewed  ?BASIC METABOLIC PANEL  ?CBC  ?D-DIMER, QUANTITATIVE  ?I-STAT BETA HCG BLOOD, ED ([facility], [facility], [facility] ONLY)  ?TROPONIN I (HIGH SENSITIVITY)  ?TROPONIN I (HIGH SENSITIVITY)  ? ?____________________________________________ ? ?EKG ? ? EKG Interpretation ? ?Date/Time:  Tuesday February 15 2022 10:19:09 EDT ?Ventricular Rate:  60 ?PR Interval:  148 ?QRS Duration: 72 ?QT Interval:  400 ?QTC Calculation: 400 ?R Axis:   40 ?Text Interpretation: Normal sinus rhythm Nonspecific T wave abnormality Abnormal ECG When compared with ECG of 30-Mar-2018 23:02, PREVIOUS ECG IS PRESENT Confirmed by 01-Apr-2018 [phone]) on 02/15/2022 2:37:08 PM ?  ? ?  ? ? ?____________________________________________ ? ? ?PROCEDURES ? ?Procedure(s) performed:  ? ?Procedures ? ?None ?____________________________________________ ? ? ?INITIAL IMPRESSION / ASSESSMENT AND PLAN / ED COURSE ? ?Pertinent labs & imaging results that were available during my care of the patient were reviewed by me and considered in my medical decision making (see chart for details). ?  ?This patient is Presenting for Evaluation of CP, which does require a range of treatment options, and is a complaint that involves a high  risk of morbidity and mortality. ? ?The Differential Diagnoses includes but is not exclusive to acute coronary syndrome, aortic dissection, pulmonary embolism, cardiac tamponade, community-acquired pneumonia, pericarditis, musculoskeletal chest wall pain, etc. ?  ?Clinical Laboratory Tests Ordered, included Troponin is  negative. BMP negative. CBC without leukocytosis.  ? ?Radiologic Tests Ordered, included CXR. I independently interpreted the images and agree with radiology interpretation.  ? ?Cardiac Monitor Tracing which shows NSR. ? ? ?Social Determinants of Health Risk patient is a non-smoker.  ? ?Medical Decision Making: Summary:  ?Patient presents to the emergency department with fairly atypical intermittent chest pain.  Did low risk by HEART score. Troponin negative x 1. Will add d-dimer due to age and inability to apply PERC rule. Second troponin pending. If negative, plan to treat likely MSK pain and advise close PCP follow up.  ? ?Reevaluation with update and discussion with patient. Discussed results and follow up plan. Patient stable for discharge.  ? ?Disposition: discharge ? ?____________________________________________ ? ?FINAL CLINICAL IMPRESSION(S) / ED DIAGNOSES ? ?Final diagnoses:  ?Nonspecific chest pain  ? ? ? ?Note:  This document was prepared using Dragon voice recognition software and may include unintentional dictation errors. ? ?Alona Bene, MD, FACEP ?Emergency Medicine ? ?  ?Maia Plan, MD ?02/22/22 [phone] ? ?

## 2022-02-15 NOTE — ED Provider Notes (Signed)
?  Physical Exam  ?BP 126/71   Pulse (!) 55   Temp 97.8 ?F (36.6 ?C) (Oral)   Resp 15   Ht 5\' 1"  (1.549 m)   Wt 77.1 kg   SpO2 100%   BMI 32.12 kg/m?  ? ?Physical Exam ? ?Procedures  ?Procedures ? ?ED Course / MDM  ?  ?Medical Decision Making ?Amount and/or Complexity of Data Reviewed ?Labs: ordered. ? ? ?Received patient signout from Dr. .  Anterior chest pain for 2 days.  EKG reassuring.  Troponin negative x2.  Negative D-dimer.  Appears stable for discharge.  Follow-up with PCP ? ? ? ? ?  ?Jacqulyn Bath, MD ?02/15/22 1709 ? ?

## 2022-02-15 NOTE — ED Provider Triage Note (Signed)
Emergency Medicine Provider Triage Evaluation Note ? ?[name] , a 56 y.o. female  was evaluated in triage.  Pt complains of 2 days of intermittent CP that radiates across her entire anterior chest. Does not rad to back or neck. ? ?No SOB/N/V, no diaphoresis ? ?HTN, anxiety. ?Non smoker ? ?Review of Systems  ?Positive: cp ?Negative: Fever, sob, LH ? ?Physical Exam  ?BP (!) 150/99   Pulse (!) 58   Temp 97.8 ?F (36.6 ?C) (Oral)   Resp 18   Ht 5\' 1"  (1.549 m)   Wt 77.1 kg   SpO2 98%   BMI 32.12 kg/m?  ?Gen:   Awake, no distress   ?Resp:  Normal effort  ?MSK:   Moves extremities without difficulty  ?Other:  Lungs CTAB, RRR ? ?Medical Decision Making  ?Medically screening exam initiated at 11:43 AM.  Appropriate orders placed.  Terilynn A Asberry was informed that the remainder of the evaluation will be completed by another provider, this initial triage assessment does not replace that evaluation, and the importance of remaining in the ED until their evaluation is complete. ? ?Labs/ekg/CXR ?  ?Welton Flakes, Gailen Shelter ?02/15/22 1145 ? ?

## 2022-02-17 DIAGNOSIS — F250 Schizoaffective disorder, bipolar type: Secondary | ICD-10-CM | POA: Diagnosis not present

## 2022-05-04 DIAGNOSIS — E785 Hyperlipidemia, unspecified: Secondary | ICD-10-CM | POA: Diagnosis not present

## 2022-06-20 DIAGNOSIS — R42 Dizziness and giddiness: Secondary | ICD-10-CM | POA: Diagnosis not present

## 2022-06-20 DIAGNOSIS — R270 Ataxia, unspecified: Secondary | ICD-10-CM | POA: Diagnosis not present

## 2022-06-20 DIAGNOSIS — Z79899 Other long term (current) drug therapy: Secondary | ICD-10-CM | POA: Diagnosis not present

## 2022-06-20 DIAGNOSIS — F429 Obsessive-compulsive disorder, unspecified: Secondary | ICD-10-CM | POA: Diagnosis present

## 2022-06-20 DIAGNOSIS — J45909 Unspecified asthma, uncomplicated: Secondary | ICD-10-CM | POA: Diagnosis present

## 2022-06-20 DIAGNOSIS — E039 Hypothyroidism, unspecified: Secondary | ICD-10-CM | POA: Diagnosis present

## 2022-06-20 DIAGNOSIS — E785 Hyperlipidemia, unspecified: Secondary | ICD-10-CM | POA: Diagnosis present

## 2022-06-20 DIAGNOSIS — Z7951 Long term (current) use of inhaled steroids: Secondary | ICD-10-CM | POA: Diagnosis not present

## 2022-06-20 DIAGNOSIS — I773 Arterial fibromuscular dysplasia: Secondary | ICD-10-CM | POA: Diagnosis not present

## 2022-06-20 DIAGNOSIS — Z79890 Hormone replacement therapy (postmenopausal): Secondary | ICD-10-CM | POA: Diagnosis not present

## 2022-06-20 DIAGNOSIS — I371 Nonrheumatic pulmonary valve insufficiency: Secondary | ICD-10-CM | POA: Diagnosis not present

## 2022-06-20 DIAGNOSIS — I10 Essential (primary) hypertension: Secondary | ICD-10-CM | POA: Diagnosis present

## 2022-06-20 DIAGNOSIS — R9431 Abnormal electrocardiogram [ECG] [EKG]: Secondary | ICD-10-CM | POA: Diagnosis not present

## 2022-06-20 DIAGNOSIS — I639 Cerebral infarction, unspecified: Secondary | ICD-10-CM | POA: Diagnosis not present

## 2022-06-20 DIAGNOSIS — F259 Schizoaffective disorder, unspecified: Secondary | ICD-10-CM | POA: Diagnosis present

## 2022-06-20 DIAGNOSIS — R29818 Other symptoms and signs involving the nervous system: Secondary | ICD-10-CM | POA: Diagnosis not present

## 2022-06-20 DIAGNOSIS — I6389 Other cerebral infarction: Secondary | ICD-10-CM | POA: Diagnosis present

## 2022-06-21 DIAGNOSIS — I639 Cerebral infarction, unspecified: Secondary | ICD-10-CM | POA: Diagnosis not present

## 2022-06-21 DIAGNOSIS — E039 Hypothyroidism, unspecified: Secondary | ICD-10-CM | POA: Diagnosis present

## 2022-06-21 DIAGNOSIS — R270 Ataxia, unspecified: Secondary | ICD-10-CM | POA: Diagnosis not present

## 2022-06-21 DIAGNOSIS — R42 Dizziness and giddiness: Secondary | ICD-10-CM | POA: Diagnosis not present

## 2022-06-21 DIAGNOSIS — I6389 Other cerebral infarction: Secondary | ICD-10-CM | POA: Diagnosis present

## 2022-06-21 DIAGNOSIS — Z79899 Other long term (current) drug therapy: Secondary | ICD-10-CM | POA: Diagnosis not present

## 2022-06-21 DIAGNOSIS — E785 Hyperlipidemia, unspecified: Secondary | ICD-10-CM | POA: Diagnosis present

## 2022-06-21 DIAGNOSIS — I773 Arterial fibromuscular dysplasia: Secondary | ICD-10-CM | POA: Diagnosis not present

## 2022-06-21 DIAGNOSIS — I371 Nonrheumatic pulmonary valve insufficiency: Secondary | ICD-10-CM | POA: Diagnosis not present

## 2022-06-21 DIAGNOSIS — F429 Obsessive-compulsive disorder, unspecified: Secondary | ICD-10-CM | POA: Diagnosis present

## 2022-06-21 DIAGNOSIS — Z7951 Long term (current) use of inhaled steroids: Secondary | ICD-10-CM | POA: Diagnosis not present

## 2022-06-21 DIAGNOSIS — F259 Schizoaffective disorder, unspecified: Secondary | ICD-10-CM | POA: Diagnosis present

## 2022-06-21 DIAGNOSIS — Z79890 Hormone replacement therapy (postmenopausal): Secondary | ICD-10-CM | POA: Diagnosis not present

## 2022-06-21 DIAGNOSIS — J45909 Unspecified asthma, uncomplicated: Secondary | ICD-10-CM | POA: Diagnosis present

## 2022-06-21 DIAGNOSIS — I10 Essential (primary) hypertension: Secondary | ICD-10-CM | POA: Diagnosis present

## 2022-06-23 DIAGNOSIS — I639 Cerebral infarction, unspecified: Secondary | ICD-10-CM | POA: Diagnosis not present

## 2022-06-23 DIAGNOSIS — I6389 Other cerebral infarction: Secondary | ICD-10-CM | POA: Diagnosis not present

## 2022-07-05 ENCOUNTER — Telehealth: Payer: Self-pay

## 2022-07-05 NOTE — Telephone Encounter (Signed)
NOTES SCANNED TO REFERRAL 

## 2022-07-21 DIAGNOSIS — F250 Schizoaffective disorder, bipolar type: Secondary | ICD-10-CM | POA: Diagnosis not present

## 2022-07-25 DIAGNOSIS — I639 Cerebral infarction, unspecified: Secondary | ICD-10-CM | POA: Diagnosis not present

## 2022-07-25 DIAGNOSIS — I6389 Other cerebral infarction: Secondary | ICD-10-CM | POA: Diagnosis not present

## 2022-08-17 ENCOUNTER — Telehealth: Payer: Self-pay | Admitting: Cardiovascular Disease

## 2022-08-17 NOTE — Telephone Encounter (Signed)
*  STAT* If patient is at the pharmacy, call can be transferred to refill team.   1. Which medications need to be refilled? (please list name of each medication and dose if known)  new prescription for  Clopidogrel  2. Which pharmacy/location (including street and city if local pharmacy) is medication to be sent to?   [facility] [address], [place]  3. Do they need a 30 day or 90 day supply?

## 2022-08-17 NOTE — Telephone Encounter (Signed)
Called pt to inform her that clopidogrel is not on her medication list and that she might want to contact her PCP, to inquiry about this medication and if she has any other problems, questions or concerns, to give our office a call back. Pt verbalized understanding.

## 2022-08-25 NOTE — Progress Notes (Signed)
CARDIOLOGY CONSULT NOTE       Patient ID: Deborah Moore MRN: [id number] DOB/AGE: August 09, 1966 56 y.o.  Admit date: (Not on file) Referring Physician: Jacelyn Grip Primary Physician: Vernie Shanks, MD (Inactive) Primary Cardiologist: New Reason for Consultation: TIA  Active Problems:   * No active hospital problems. *   HPI:  56 y.o. with history of schizoaffective disoreder, HTN, hypothyroidism Hosptalized in [place] for TIA end of August. She presented with headache and dizziness TTE during admission benign EF 55-60% no PFO negative bubble study ECG/Telemetry with no afib NSR nonspecific ST changes 828/23 MRI with small acute/subacute left occipital infarct ? Additional punctate subacute left frontal and parietal lesions She was d/c on ASA/Plavix   She has slow speech ? From schizo affective dx. She went to [employer] and met her husband whose from [place] there No palpitations, pre syncope, chest pain. 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   ROS All other systems reviewed and negative except as noted above  Past Medical History:  Diagnosis Date   Anxiety    Dizziness    Fibromuscular dysplasia (HCC)    Headache    migraines   Hypertension    Hypothyroidism    Ischemic stroke ([place])     Family History  Problem Relation Age of Onset   Breast cancer Neg Hx     Social History   Socioeconomic History   Marital status: Married    Spouse name: Not on file   Number of children: Not on file   Years of education: Not on file   Highest education level: Not on file  Occupational History   Not on file  Tobacco Use   Smoking status: Never   Smokeless tobacco: Never  Vaping Use   Vaping Use: Never used  Substance and Sexual Activity   Alcohol use: No   Drug use: No   Sexual activity: Not on file  Other Topics Concern   Not on file  Social History Narrative   Not on file   Social Determinants of Health   Financial Resource Strain: Not on file  Food Insecurity: Not  on file  Transportation Needs: Not on file  Physical Activity: Not on file  Stress: Not on file  Social Connections: Not on file  Intimate Partner Violence: Not on file    Past Surgical History:  Procedure Laterality Date   COLONOSCOPY WITH PROPOFOL N/A 01/26/2016   Procedure: COLONOSCOPY WITH PROPOFOL;  Surgeon: Garlan Fair, MD;  Location: [facility];  Service: Endoscopy;  Laterality: N/A;   NO PAST SURGERIES        Current Outpatient Medications:    levothyroxine (SYNTHROID) 50 MCG tablet, Take 50 mcg by mouth daily., Disp: , Rfl:    aspirin EC 81 MG tablet, Take 81 mg by mouth daily. , Disp: , Rfl:    atenolol (TENORMIN) 25 MG tablet, Take 25 mg by mouth daily with breakfast. , Disp: , Rfl: 6   azelastine (ASTELIN) 0.1 % nasal spray, Place 2 sprays into both nostrils 2 (two) times daily. Use in each nostril as directed, Disp: , Rfl:    clonazePAM (KLONOPIN) 1 MG tablet, Take 1 mg by mouth 2 (two) times daily as needed for anxiety. , Disp: , Rfl: 5   doxycycline (VIBRAMYCIN) 100 MG capsule, Take 1 capsule (100 mg total) by mouth 2 (two) times daily., Disp: 20 capsule, Rfl: 0   haloperidol (HALDOL) 5 MG tablet, Take 5 mg by mouth at bedtime.,  Disp: , Rfl: 5   ipratropium (ATROVENT) 0.06 % nasal spray, USE 2 SPRAYS INTO EACH NOSTRIL 4 TIMES A DAY, Disp: , Rfl: 0   methimazole (TAPAZOLE) 10 MG tablet, Take 10 mg by mouth daily with breakfast. , Disp: , Rfl: 5   Multiple Vitamin (MULTIVITAMIN WITH MINERALS) TABS tablet, Take 1 tablet by mouth daily. *Solgar*, Disp: , Rfl:    predniSONE (DELTASONE) 20 MG tablet, Take 2 tablets (40 mg total) by mouth daily., Disp: 10 tablet, Rfl: 0   sertraline (ZOLOFT) 100 MG tablet, Take 100 mg by mouth every morning., Disp: , Rfl: 5   simvastatin (ZOCOR) 20 MG tablet, Take 20 mg by mouth daily. , Disp: , Rfl: 10   topiramate (TOPAMAX) 50 MG tablet, Take 50 mg by mouth 2 (two) times daily., Disp: , Rfl: 5   traZODone (DESYREL) 100 MG tablet, Take 100  mg by mouth at bedtime., Disp: , Rfl: 0    Physical Exam: Blood pressure 110/70, pulse 78, height _0  (1.549 m), weight 171 lb 3.2 oz (77.7 kg), SpO2 96 %.    Affect appropriate Healthy:  appears stated age 34: normal Neck supple with no adenopathy JVP normal no bruits no thyromegaly Lungs clear with no wheezing and good diaphragmatic motion Heart:  S1/S2 no murmur, no rub, gallop or click PMI normal Abdomen: benighn, BS positve, no tenderness, no AAA no bruit.  No HSM or HJR Distal pulses intact with no bruits No edema Neuro non-focal Skin warm and dry No muscular weakness   Labs:   Lab Results  Component Value Date   WBC 6.8 02/15/2022   HGB 12.4 02/15/2022   HCT 38.2 02/15/2022   MCV 96.2 02/15/2022   PLT 176 02/15/2022   No results for input(s): "NA", "K", "CL", "CO2", "BUN", "CREATININE", "CALCIUM", "PROT", "BILITOT", "ALKPHOS", "ALT", "AST", "GLUCOSE" in the last 168 hours.  Invalid input(s): "LABALBU" No results found for: "CKTOTAL", "CKMB", "CKMBINDEX", "TROPONINI" No results found for: "CHOL" No results found for: "HDL" No results found for: "LDLCALC" No results found for: "TRIG" No results found for: "CHOLHDL" No results found for: "LDLDIRECT"    Radiology: No results found.  EKG: IN [place] NSR    ASSESSMENT AND PLAN:   TIA/Stroke:  suggestion of embolic involving multiple lobes ECG/TTE ok Negative bubble no PAF noted will order 30 day monitor Continue ASA/Plavix HTN: continue Atenolol f/u primary  Schizo-Affective:  f/u Behavioral health on Klonopin, Haldol and Zoloft HLD in stetting of TIA on statin labs with primary Thyroid:  appears to still be on suppressive Rx with Tapazole check TSH/T4 as abnormality may increase risk of PAF  30 day monitor TSH/T4  F/U PRN   Signed: Jenkins Rouge 09/02/2022, 2:46 PM

## 2022-09-02 ENCOUNTER — Telehealth: Payer: Self-pay | Admitting: Cardiovascular Disease

## 2022-09-02 ENCOUNTER — Encounter: Payer: Self-pay | Admitting: Cardiovascular Disease

## 2022-09-02 ENCOUNTER — Ambulatory Visit: Payer: Medicare Other | Attending: Cardiovascular Disease | Admitting: Cardiovascular Disease

## 2022-09-02 ENCOUNTER — Other Ambulatory Visit: Payer: Self-pay | Admitting: Cardiovascular Disease

## 2022-09-02 VITALS — BP 110/70 | HR 78 | Ht 61.0 in | Wt 171.2 lb

## 2022-09-02 DIAGNOSIS — E782 Mixed hyperlipidemia: Secondary | ICD-10-CM

## 2022-09-02 DIAGNOSIS — I6381 Other cerebral infarction due to occlusion or stenosis of small artery: Secondary | ICD-10-CM | POA: Diagnosis not present

## 2022-09-02 DIAGNOSIS — I10 Essential (primary) hypertension: Secondary | ICD-10-CM | POA: Diagnosis not present

## 2022-09-02 DIAGNOSIS — R42 Dizziness and giddiness: Secondary | ICD-10-CM

## 2022-09-02 NOTE — Telephone Encounter (Signed)
Informed patient that the sooner she wears the monitor the better. Informed patient that it was her choice to when she would wear it, but she is delaying her care if her stroke was caused by any arrhythmias. Patient verbalized understanding.

## 2022-09-02 NOTE — Telephone Encounter (Signed)
Patient called stating she is due to wear a heart monitor and just remembered she will be going to [facility] for two weeks.  Patient wants to know if she can wear the heart monitor when she gets back.

## 2022-09-02 NOTE — Patient Instructions (Signed)
Medication Instructions:  Your physician recommends that you continue on your current medications as directed. Please refer to the Current Medication list given to you today.  *If you need a refill on your cardiac medications before your next appointment, please call your pharmacy*  Lab Work: Your physician recommends that you have lab work today- TSH rfx.  If you have labs (blood work) drawn today and your tests are completely normal, you will receive your results only by: MyChart Message (if you have MyChart) OR A paper copy in the mail If you have any lab test that is abnormal or we need to change your treatment, we will call you to review the results.   Testing/Procedures: Your physician has recommended that you wear an event monitor. Event monitors are medical devices that record the heart's electrical activity. Doctors most often [place] these monitors to diagnose arrhythmias. Arrhythmias are problems with the speed or rhythm of the heartbeat. The monitor is a small, portable device. You can wear one while you do your normal daily activities. This is usually used to diagnose what is causing palpitations/syncope (passing out).  Follow-Up: At [facility], you and your health needs are our priority.  As part of our continuing mission to provide you with exceptional heart care, we have created designated Provider Care Teams.  These Care Teams include your primary Cardiologist (physician) and Advanced Practice Providers (APPs -  Physician Assistants and Nurse Practitioners) who all work together to provide you with the care you need, when you need it.  We recommend signing up for the patient portal called "MyChart".  Sign up information is provided on this After Visit Summary.  MyChart is used to connect with patients for Virtual Visits (Telemedicine).  Patients are able to view lab/test results, encounter notes, upcoming appointments, etc.  Non-urgent messages can be sent to your provider  as well.   To learn more about what you can do with MyChart, go to [email or web address].    Your next appointment:   As needed  The format for your next appointment:   In Person  Provider:   Charlton Haws, MD     Important Information About Sugar

## 2022-09-03 LAB — T4F: T4,Free (Direct): 1.03 ng/dL (ref 0.82–1.77)

## 2022-09-03 LAB — TSH RFX ON ABNORMAL TO FREE T4: TSH: 8.07 u[IU]/mL — ABNORMAL HIGH (ref 0.450–4.500)

## 2022-09-05 ENCOUNTER — Telehealth: Payer: Self-pay

## 2022-09-05 DIAGNOSIS — I6381 Other cerebral infarction due to occlusion or stenosis of small artery: Secondary | ICD-10-CM

## 2022-09-05 NOTE — Telephone Encounter (Signed)
Called patient to let her know Dr. Eden Emms is okay with placing referral. Placed referral for patient.

## 2022-09-05 NOTE — Telephone Encounter (Signed)
This encounter was created in error - please disregard.

## 2022-09-05 NOTE — Telephone Encounter (Signed)
Called patient with lab results. Patient is aware of TSH elevation.

## 2022-09-08 ENCOUNTER — Telehealth: Payer: Self-pay | Admitting: Cardiovascular Disease

## 2022-09-08 NOTE — Telephone Encounter (Signed)
Patient called to follow-up with RN regarding sending in prescription for her thyroid medication to [id number] - [place], [place] - [address]

## 2022-09-08 NOTE — Telephone Encounter (Signed)
Called patient back about message. Patient was wondering if we sent in anything for her Thyroid. Informed patient that we do not prescribe thyroid medications and she needs to contact her PCP about her lab results that we faxed to them. Patient verbalized understanding.

## 2022-09-13 ENCOUNTER — Telehealth: Payer: Self-pay | Admitting: Cardiovascular Disease

## 2022-09-13 NOTE — Telephone Encounter (Signed)
I spoke with patient who reports she wore a monitor when in [place] in September 2023.  She will ask for results to be sent to Dr Eden Emms. Patient has not applied monitor yet that was ordered by Dr Eden Emms.  She is out of town and will apply monitor when she returns home later today

## 2022-09-13 NOTE — Telephone Encounter (Signed)
Patient is calling bout the heart monitor she wore. Please advise

## 2022-09-14 NOTE — Telephone Encounter (Signed)
Pt is back from town and needs to now how to put monitor on, please advise.

## 2022-09-14 NOTE — Telephone Encounter (Signed)
Patient scheduled to come in Monday 09/19/22 at 2:00PM  to have monitor applied or to give tutorial on how to use monitor.  Monitor mailed to patient 09/02/22.

## 2022-09-19 ENCOUNTER — Ambulatory Visit: Payer: Medicare Other | Attending: Cardiovascular Disease

## 2022-09-19 DIAGNOSIS — I6381 Other cerebral infarction due to occlusion or stenosis of small artery: Secondary | ICD-10-CM | POA: Insufficient documentation

## 2022-09-19 DIAGNOSIS — E782 Mixed hyperlipidemia: Secondary | ICD-10-CM | POA: Diagnosis not present

## 2022-09-19 DIAGNOSIS — I10 Essential (primary) hypertension: Secondary | ICD-10-CM | POA: Diagnosis not present

## 2022-09-19 DIAGNOSIS — R42 Dizziness and giddiness: Secondary | ICD-10-CM | POA: Insufficient documentation

## 2022-09-19 NOTE — Progress Notes (Unsigned)
[id number] mailed to patient 09/02/22 and applied in office 09/19/22.

## 2022-09-20 DIAGNOSIS — J3489 Other specified disorders of nose and nasal sinuses: Secondary | ICD-10-CM | POA: Diagnosis not present

## 2022-09-20 DIAGNOSIS — F319 Bipolar disorder, unspecified: Secondary | ICD-10-CM | POA: Diagnosis not present

## 2022-09-20 DIAGNOSIS — I10 Essential (primary) hypertension: Secondary | ICD-10-CM | POA: Diagnosis not present

## 2022-09-20 DIAGNOSIS — Z8673 Personal history of transient ischemic attack (TIA), and cerebral infarction without residual deficits: Secondary | ICD-10-CM | POA: Diagnosis not present

## 2022-09-20 DIAGNOSIS — E785 Hyperlipidemia, unspecified: Secondary | ICD-10-CM | POA: Diagnosis not present

## 2022-09-20 DIAGNOSIS — Z23 Encounter for immunization: Secondary | ICD-10-CM | POA: Diagnosis not present

## 2022-09-20 DIAGNOSIS — J4540 Moderate persistent asthma, uncomplicated: Secondary | ICD-10-CM | POA: Diagnosis not present

## 2022-09-20 DIAGNOSIS — Z6834 Body mass index (BMI) 34.0-34.9, adult: Secondary | ICD-10-CM | POA: Diagnosis not present

## 2022-09-20 DIAGNOSIS — E890 Postprocedural hypothyroidism: Secondary | ICD-10-CM | POA: Diagnosis not present

## 2022-09-27 ENCOUNTER — Encounter: Payer: Self-pay | Admitting: Cardiovascular Disease

## 2022-09-27 NOTE — Telephone Encounter (Signed)
error 

## 2022-10-06 ENCOUNTER — Telehealth: Payer: Self-pay | Admitting: Cardiovascular Disease

## 2022-10-06 NOTE — Telephone Encounter (Signed)
Caller is reporting abnormal results. 

## 2022-10-06 NOTE — Telephone Encounter (Signed)
11:20 am CST Pt initiated alert. Skipped beats, passed out.  ST 125 bpm  Has been uploaded to site. Will fax to [phone].   Called the patient.  She did not pass out.  She said the light keeps flashing on the phone and she was pressing buttons to see what would stop it.  The green light is blinking on the phone now.   She has the number in the booklet for the monitor company.  She will call them to confirm all is well.

## 2022-10-08 ENCOUNTER — Other Ambulatory Visit: Payer: Self-pay

## 2022-10-08 ENCOUNTER — Encounter (HOSPITAL_COMMUNITY): Payer: Self-pay

## 2022-10-08 ENCOUNTER — Emergency Department (HOSPITAL_COMMUNITY)
Admission: EM | Admit: 2022-10-08 | Discharge: 2022-10-08 | Disposition: A | Discharge status: Home / Self Care | Payer: Medicare Other | Attending: Emergency Medicine | Admitting: Emergency Medicine

## 2022-10-08 ENCOUNTER — Emergency Department (HOSPITAL_COMMUNITY): Payer: Medicare Other

## 2022-10-08 DIAGNOSIS — E039 Hypothyroidism, unspecified: Secondary | ICD-10-CM | POA: Insufficient documentation

## 2022-10-08 DIAGNOSIS — R55 Syncope and collapse: Secondary | ICD-10-CM | POA: Diagnosis not present

## 2022-10-08 DIAGNOSIS — Z79899 Other long term (current) drug therapy: Secondary | ICD-10-CM | POA: Diagnosis not present

## 2022-10-08 DIAGNOSIS — I10 Essential (primary) hypertension: Secondary | ICD-10-CM | POA: Insufficient documentation

## 2022-10-08 DIAGNOSIS — Z7982 Long term (current) use of aspirin: Secondary | ICD-10-CM | POA: Insufficient documentation

## 2022-10-08 DIAGNOSIS — R42 Dizziness and giddiness: Secondary | ICD-10-CM | POA: Diagnosis not present

## 2022-10-08 DIAGNOSIS — R519 Headache, unspecified: Secondary | ICD-10-CM | POA: Diagnosis not present

## 2022-10-08 LAB — CBC
HCT: 38.7 % (ref 36.0–46.0)
Hemoglobin: 12.2 g/dL (ref 12.0–15.0)
MCH: 30.7 pg (ref 26.0–34.0)
MCHC: 31.5 g/dL (ref 30.0–36.0)
MCV: 97.5 fL (ref 80.0–100.0)
Platelets: 206 10*3/uL (ref 150–400)
RBC: 3.97 MIL/uL (ref 3.87–5.11)
RDW: 13.4 % (ref 11.5–15.5)
WBC: 6.6 10*3/uL (ref 4.0–10.5)
nRBC: 0 % (ref 0.0–0.2)

## 2022-10-08 LAB — DIFFERENTIAL
Abs Immature Granulocytes: 0.02 10*3/uL (ref 0.00–0.07)
Basophils Absolute: 0.1 10*3/uL (ref 0.0–0.1)
Basophils Relative: 1 %
Eosinophils Absolute: 0.3 10*3/uL (ref 0.0–0.5)
Eosinophils Relative: 5 %
Immature Granulocytes: 0 %
Lymphocytes Relative: 26 %
Lymphs Abs: 1.7 10*3/uL (ref 0.7–4.0)
Monocytes Absolute: 0.4 10*3/uL (ref 0.1–1.0)
Monocytes Relative: 7 %
Neutro Abs: 4 10*3/uL (ref 1.7–7.7)
Neutrophils Relative %: 61 %

## 2022-10-08 LAB — COMPREHENSIVE METABOLIC PANEL
ALT: 21 U/L (ref 0–44)
AST: 29 U/L (ref 15–41)
Albumin: 4.2 g/dL (ref 3.5–5.0)
Alkaline Phosphatase: 87 U/L (ref 38–126)
Anion gap: 11 (ref 5–15)
BUN: 23 mg/dL — ABNORMAL HIGH (ref 6–20)
CO2: 21 mmol/L — ABNORMAL LOW (ref 22–32)
Calcium: 9.5 mg/dL (ref 8.9–10.3)
Chloride: 106 mmol/L (ref 98–111)
Creatinine, Ser: 1.06 mg/dL — ABNORMAL HIGH (ref 0.44–1.00)
GFR, Estimated: >60 mL/min (ref >60)
Glucose, Bld: 90 mg/dL (ref 70–99)
Potassium: 4.2 mmol/L (ref 3.5–5.1)
Sodium: 138 mmol/L (ref 135–145)
Total Bilirubin: 0.8 mg/dL (ref 0.3–1.2)
Total Protein: 8.8 g/dL — ABNORMAL HIGH (ref 6.5–8.1)

## 2022-10-08 LAB — PROTIME-INR
INR: 1.2 (ref 0.8–1.2)
Prothrombin Time: 14.6 seconds (ref 11.4–15.2)

## 2022-10-08 LAB — APTT: aPTT: 26 seconds (ref 24–36)

## 2022-10-08 NOTE — ED Provider Triage Note (Signed)
Emergency Medicine Provider Triage Evaluation Note  Deborah Moore , a 56 y.o. female  was evaluated in triage.  Pt complains of headache, dizzinessx2 days. Hx of CVA. Compliant w/plavix and baby aspirin. Denies unilateral weakness or numbness. Reports intermittent chronic ringing of ears. No hearing loss  Review of Systems  Positive: dizziness Negative: numbness  Physical Exam  BP (!) 142/88   Pulse 89   Temp 98 F (36.7 C) (Oral)   Resp 18   SpO2 100%  Gen:   Awake, no distress   Resp:  Normal effort  MSK:   Moves extremities without difficulty  Other:  Slowed speech, equal strength of BUE and BLE. No facial droop  Medical Decision Making  Medically screening exam initiated at 10:21 AM.  Appropriate orders placed.  Deborah Moore was informed that the remainder of the evaluation will be completed by another provider, this initial triage assessment does not replace that evaluation, and the importance of remaining in the ED until their evaluation is complete.     Pete Pelt, [place] 10/08/22 1026

## 2022-10-08 NOTE — Discharge Instructions (Signed)
Commend following up with neurology information provided above call and make an appointment for the chronic intermittent headaches and intermittent dizziness.  CT scan of the head here tonight and lab work without any significant abnormalities.

## 2022-10-08 NOTE — ED Provider Notes (Signed)
[facility] DEPT Provider Note   CSN: [id number] Arrival date & time: 10/08/22  [id number]     History  Chief Complaint  Patient presents with   Headache    Deborah Moore is a 56 y.o. female.  Patient with a complaint of intermittent headache mostly posteriorly and intermittent dizziness for over a year.  Patient has a history of a prior ischemic stroke.  Also history of hypertension hypothyroidism and fibromuscular dysplasia.  Patient followed by cardiology patient referred probably after history of chest pain in the past.  Patient states that the symptoms that she has dizziness sounds like a little bit of mild vertigo and the headache is mostly posteriorly.  Kind of on both sides.  Nothing new or worse.  Family member with her.  Triage note make something about worsening the last couple days but patient really says all this has been going on for a long time.  Nothing significantly worse.  She is on Plavix and she does take baby aspirin.  Patient not on a blood thinner.  Cardiology referred her to [facility] neurology but she has not followed up with them yet.  In addition there are some history of some falls.  None were recent as and like not in the last 2 days.       Home Medications Prior to Admission medications   Medication Sig Start Date End Date Taking? Authorizing Provider  aspirin EC 81 MG tablet Take 81 mg by mouth daily.     [provider]  atenolol (TENORMIN) 25 MG tablet Take 25 mg by mouth daily with breakfast.  12/30/15   [provider]  azelastine (ASTELIN) 0.1 % nasal spray Place 2 sprays into both nostrils 2 (two) times daily. Use in each nostril as directed    [provider]  clonazePAM (KLONOPIN) 1 MG tablet Take 1 mg by mouth 2 (two) times daily as needed for anxiety.  01/15/16   [provider]  doxycycline (VIBRAMYCIN) 100 MG capsule Take 1 capsule (100 mg total) by mouth 2 (two) times daily. 03/31/18    Muthersbaugh, Jarrett Soho, PA-C  haloperidol (HALDOL) 5 MG tablet Take 5 mg by mouth at bedtime. 12/22/15   [provider]  ipratropium (ATROVENT) 0.06 % nasal spray USE 2 SPRAYS INTO EACH NOSTRIL 4 TIMES A DAY 03/13/18   [provider]  levothyroxine (SYNTHROID) 50 MCG tablet Take 50 mcg by mouth daily. 07/19/22   [provider]  methimazole (TAPAZOLE) 10 MG tablet Take 10 mg by mouth daily with breakfast.  12/18/15   [provider]  Multiple Vitamin (MULTIVITAMIN WITH MINERALS) TABS tablet Take 1 tablet by mouth daily. *Solgar*    [provider]  predniSONE (DELTASONE) 20 MG tablet Take 2 tablets (40 mg total) by mouth daily. 03/31/18   Muthersbaugh, Jarrett Soho, PA-C  sertraline (ZOLOFT) 100 MG tablet Take 100 mg by mouth every morning. 12/30/15   [provider]  simvastatin (ZOCOR) 20 MG tablet Take 20 mg by mouth daily.  12/29/15   [provider]  topiramate (TOPAMAX) 50 MG tablet Take 50 mg by mouth 2 (two) times daily. 12/22/15   [provider]  traZODone (DESYREL) 100 MG tablet Take 100 mg by mouth at bedtime. 01/25/18   [provider]      Allergies    Other    Review of Systems   Review of Systems  Constitutional:  Negative for chills and fever.  HENT:  Negative for  ear pain and sore throat.   Eyes:  Negative for pain and visual disturbance.  Respiratory:  Negative for cough and shortness of breath.   Cardiovascular:  Negative for chest pain and palpitations.  Gastrointestinal:  Negative for abdominal pain and vomiting.  Genitourinary:  Negative for dysuria and hematuria.  Musculoskeletal:  Negative for arthralgias and back pain.  Skin:  Negative for color change and rash.  Neurological:  Positive for dizziness and headaches. Negative for seizures and syncope.  All other systems reviewed and are negative.   Physical Exam Updated Vital Signs BP 128/73 (BP Location: Right Arm)   Pulse 90   Temp 99.1 F (37.3  C) (Oral)   Resp 16   SpO2 93%  Physical Exam Vitals and nursing note reviewed.  Constitutional:      General: She is not in acute distress.    Appearance: She is well-developed. She is not ill-appearing.  HENT:     Head: Normocephalic and atraumatic.  Eyes:     General: No visual field deficit.    Extraocular Movements: Extraocular movements intact.     Conjunctiva/sclera: Conjunctivae normal.     Pupils: Pupils are equal, round, and reactive to light.  Cardiovascular:     Rate and Rhythm: Normal rate and regular rhythm.     Heart sounds: No murmur heard. Pulmonary:     Effort: Pulmonary effort is normal. No respiratory distress.     Breath sounds: Normal breath sounds.  Abdominal:     Palpations: Abdomen is soft.     Tenderness: There is no abdominal tenderness.  Musculoskeletal:        General: No swelling.     Cervical back: Neck supple.  Skin:    General: Skin is warm and dry.     Capillary Refill: Capillary refill takes less than 2 seconds.  Neurological:     Mental Status: She is alert and oriented to person, place, and time.     Cranial Nerves: No cranial nerve deficit or facial asymmetry.     Sensory: No sensory deficit.     Motor: No weakness.  Psychiatric:        Mood and Affect: Mood normal.        Speech: Speech normal.     ED Results / Procedures / Treatments   Labs (all labs ordered are listed, but only abnormal results are displayed) Labs Reviewed  COMPREHENSIVE METABOLIC PANEL - Abnormal; Notable for the following components:      Result Value   CO2 21 (*)    BUN 23 (*)    Creatinine, Ser 1.06 (*)    Total Protein 8.8 (*)    All other components within normal limits  PROTIME-INR  APTT  CBC  DIFFERENTIAL  ETHANOL  RAPID URINE DRUG SCREEN, HOSP PERFORMED  URINALYSIS, ROUTINE W REFLEX MICROSCOPIC    EKG EKG Interpretation  Date/Time:  Saturday October 08 2022 10:05:01 EST Ventricular Rate:  90 PR Interval:  152 QRS Duration: 86 QT  Interval:  366 QTC Calculation: 448 R Axis:   43 Text Interpretation: Sinus rhythm Abnormal R-wave progression, early transition Borderline T abnormalities, anterior leads Confirmed by Fredia Sorrow [phone]) on 10/08/2022 7:46:31 PM  Radiology CT Head Wo Contrast  Result Date: 10/08/2022 CLINICAL DATA:  56 year old female with syncope today. EXAM: CT HEAD WITHOUT CONTRAST TECHNIQUE: Contiguous axial images were obtained from the base of the skull through the vertex without intravenous contrast. RADIATION DOSE REDUCTION: This exam was performed according to the  departmental dose-optimization program which includes automated exposure control, adjustment of the mA and/or kV according to patient size and/or use of iterative reconstruction technique. COMPARISON:  None Available. FINDINGS: Brain: No evidence of acute infarction, hemorrhage, hydrocephalus, extra-axial collection or mass lesion/mass effect. A remote posterior RIGHT parietal infarct is noted. Mild probable chronic small-vessel white matter ischemic changes noted. Vascular: No hyperdense vessel or unexpected calcification. Skull: Normal. Negative for fracture or focal lesion. Sinuses/Orbits: No acute finding. Other: None. IMPRESSION: 1. No evidence of acute intracranial abnormality. 2. Remote posterior RIGHT parietal infarct and mild probable chronic small-vessel white matter ischemic changes. Electronically Signed   By: Harmon Pier M.D.   On: 10/08/2022 11:38    Procedures Procedures    Medications Ordered in ED Medications - No data to display  ED Course/ Medical Decision Making/ A&P                           Medical Decision Making  Labs here without any acute abnormalities.  CBC white blood cell count 6.6 hemoglobin 12.2 platelets 206.  Complete metabolic panel creatinine up some at 1.06 with GFR greater than 60 electrolytes normal LFTs normal.  And CO2 down a little bit at 21.  Anion gap is 11 CT of the head no evidence of an  acute intracranial abnormality no evidence of any traumatic injury.  Remote posterior right parietal infarct and mild probable chronic small vessel white matter ischemic changes.  Since symptoms are essentially unchanged over the last year I do not see a reason for emergent MRI.  But patient does need to follow-up with neurology.  Suspect that these episodes of dizziness and headaches could very well be post residual from her stroke.  But MRI may be indicated as an outpatient.   Final Clinical Impression(s) / ED Diagnoses Final diagnoses:  Headache disorder    Rx / DC Orders ED Discharge Orders     None         Vanetta Mulders, MD 10/08/22 2036

## 2022-10-08 NOTE — ED Triage Notes (Signed)
Pt arrived via POV, c/o headache and dizziness x1 year. States worsening the last couple days.

## 2022-10-18 ENCOUNTER — Encounter: Payer: Self-pay | Admitting: Neurology

## 2022-10-21 NOTE — Progress Notes (Deleted)
NEUROLOGY CONSULTATION NOTE  Deborah Moore MRN: [id number] DOB: 06-22-1966  Referring provider: Charlton Haws, MD Primary care provider: Peri Maris, FNP  Reason for consult:  stroke  Assessment/Plan:   ***   Subjective:  Deborah Moore is a 56 year old ***-handed female with hypertension, fibromuscular dysplasia, hypothyroidism, migraines, schizoaffective disorder, obsessive compulsive disorder and anxiety who presents for stroke.  History supplemented by hospital records and referring provider's notes.  She reports intermittent dizziness since hitting her head after a fall ***.  On 06/20/2022, she was in [place], [place] when she developed more severe dizziness with bifrontal headache, visual aura (bright lights in peripheral vision) and ataxia.  She presented to [facility] where MRI of brain revealed acute and subacute infarct in the left occipital lobe and subacute and acute punctate infarcts in the left frontal, parietal, occipital lobes and possibly left cerebellar hemisphere.  CTA head and neck revealed evidence of fibromuscular dysplasia involving the cervical carotid arteries but no intracranial or extracranial LVO or hemodynamically significant stenosis .  2D echo revealed LVEF 55-60% with negative bubble study.  Telemetry revealed no afib.  She was discharged on ASA, Plavix and ***.  She followed up with cardiology here in [place].  She had a 30 day cardiac event monitor ***.  Due to ongoing episodes of dizziness and headache, she went to the ED on 10/08/2022 where CT head personally reviewed showed remote right parietal infarct and mild chronic small vessel ischemic changes but no acute findings.  ***  Past NSAIDS/analgesics:  *** Past abortive triptans:  *** Past abortive ergotamine:  *** Past muscle relaxants:  *** Past anti-emetic:  *** Past antihypertensive medications:  *** Past antidepressant medications:  *** Past anticonvulsant medications:  *** Past  anti-CGRP:  *** Past vitamins/Herbal/Supplements:  *** Past antihistamines/decongestants:  *** Other past therapies:  ***  Current NSAIDS/analgesics:  ASA 81mg  daily Current triptans:  *** Current ergotamine:  *** Current anti-emetic:  *** Current muscle relaxants:  *** Current Antihypertensive medications:  atenolol 25mg  daily Current Antidepressant medications:  sertraline 100mg  daily, trazodone 100mg  QHS (insomnia) Current Anticonvulsant medications:  topiramate 50mg  BID Current anti-CGRP:  *** Other therapy:  *** Birth control:  *** Other medications:  Haldol, Kloopin, simvastatin 20mg  daily   Caffeine:  *** Alcohol:  *** Smoker:  *** Diet:  *** Exercise:  *** Depression:  ***; Anxiety:  *** Other pain:  *** Sleep hygiene:  *** Family history of headache:  ***  06/20/2022 CT HEAD:  No CT evidence of an acute intracranial abnormality.  Probable chronic infarcts in the parietooccipital lobes. 06/20/2022 CT PERFUSION:  No evidence of perfusion defect or mismatch.  06/20/2022 CTA HEAD & NECK:  1. No intracranial large vessel occlusion or high-grade arterial stenosis.  2.  No cervical carotid or vertebral artery stenosis.  3.  Luminal regularities of the cervical carotid arteries suggestive of fibromuscular dysplasia.  4.  Small 1 cm enhancing region in the right palatine tonsil; differential considerations include underlying lesion versus phlegmon/early abscess.  Correlate with direct visualization.   06/21/2022 MRI BRAIN:  Small acute or subacute infarct in the left occipital lobe.  There appear to be several additional punctate subacute appearing infarcts involving the left frontal lobe, left parietal lobe, left occipital lobe, and possibly within the right cerebellum; minimal chronic microvascular ischemic changes.    PAST MEDICAL HISTORY: Past Medical History:  Diagnosis Date   Anxiety    Dizziness    Fibromuscular dysplasia (HCC)  Headache    migraines    Hypertension    Hypothyroidism    Ischemic stroke (HCC)     PAST SURGICAL HISTORY: Past Surgical History:  Procedure Laterality Date   COLONOSCOPY WITH PROPOFOL N/A 01/26/2016   Procedure: COLONOSCOPY WITH PROPOFOL;  Surgeon: Charolett Bumpers, MD;  Location: [facility];  Service: Endoscopy;  Laterality: N/A;   NO PAST SURGERIES      MEDICATIONS: Current Outpatient Medications on File Prior to Visit  Medication Sig Dispense Refill   aspirin EC 81 MG tablet Take 81 mg by mouth daily.      atenolol (TENORMIN) 25 MG tablet Take 25 mg by mouth daily with breakfast.   6   azelastine (ASTELIN) 0.1 % nasal spray Place 2 sprays into both nostrils 2 (two) times daily. Use in each nostril as directed     clonazePAM (KLONOPIN) 1 MG tablet Take 1 mg by mouth 2 (two) times daily as needed for anxiety.   5   doxycycline (VIBRAMYCIN) 100 MG capsule Take 1 capsule (100 mg total) by mouth 2 (two) times daily. 20 capsule 0   haloperidol (HALDOL) 5 MG tablet Take 5 mg by mouth at bedtime.  5   ipratropium (ATROVENT) 0.06 % nasal spray USE 2 SPRAYS INTO EACH NOSTRIL 4 TIMES A DAY  0   levothyroxine (SYNTHROID) 50 MCG tablet Take 50 mcg by mouth daily.     methimazole (TAPAZOLE) 10 MG tablet Take 10 mg by mouth daily with breakfast.   5   Multiple Vitamin (MULTIVITAMIN WITH MINERALS) TABS tablet Take 1 tablet by mouth daily. *Solgar*     predniSONE (DELTASONE) 20 MG tablet Take 2 tablets (40 mg total) by mouth daily. 10 tablet 0   sertraline (ZOLOFT) 100 MG tablet Take 100 mg by mouth every morning.  5   simvastatin (ZOCOR) 20 MG tablet Take 20 mg by mouth daily.   10   topiramate (TOPAMAX) 50 MG tablet Take 50 mg by mouth 2 (two) times daily.  5   traZODone (DESYREL) 100 MG tablet Take 100 mg by mouth at bedtime.  0   No current facility-administered medications on file prior to visit.    ALLERGIES: Allergies  Allergen Reactions   Other     FAMILY HISTORY: Family History  Problem Relation Age  of Onset   Breast cancer Neg Hx     Objective:  *** General: No acute distress.  Patient appears well-groomed.   Head:  Normocephalic/atraumatic Eyes:  fundi examined but not visualized Neck: supple, no paraspinal tenderness, full range of motion Back: No paraspinal tenderness Heart: regular rate and rhythm Lungs: Clear to auscultation bilaterally. Vascular: No carotid bruits. Neurological Exam: Mental status: alert and oriented to person, place, and time, speech fluent and not dysarthric, language intact. Cranial nerves: CN I: not tested CN II: pupils equal, round and reactive to light, visual fields intact CN III, IV, VI:  full range of motion, no nystagmus, no ptosis CN V: facial sensation intact. CN VII: upper and lower face symmetric CN VIII: hearing intact CN IX, X: gag intact, uvula midline CN XI: sternocleidomastoid and trapezius muscles intact CN XII: tongue midline Bulk & Tone: normal, no fasciculations. Motor:  muscle strength 5/5 throughout Sensation:  Pinprick, temperature and vibratory sensation intact. Deep Tendon Reflexes:  2+ throughout,  toes downgoing.   Finger to nose testing:  Without dysmetria.   Heel to shin:  Without dysmetria.   Gait:  Normal station and stride.  Romberg  negative.    Thank you for allowing me to take part in the care of this patient.  Metta Clines, DO  CC: ***

## 2022-11-01 ENCOUNTER — Ambulatory Visit: Payer: Medicare Other | Admitting: Neurology

## 2022-11-14 NOTE — Progress Notes (Signed)
NEUROLOGY CONSULTATION NOTE  Deborah Moore MRN: [id number] DOB: 1966-01-04  Referring provider: Charlton Haws, MD Primary care provider: Peri Maris, FNP  Reason for consult:  stroke  Assessment/Plan:   Left hemispheric infarcts, embolic of unknown source.  As they involve more than one vascular territory, consider cardioembolic Bilateral cervical fibromuscular dysplasia Hypertension Hyperlipidemia  At this time, she may discontinue Plavix and remain on ASA 81mg  daily monotherapy Secondary stroke prevention as managed by PCP: Statin.  LDL goal less than 70 Hgb goal less than 7 Normotensive blood pressure Recommend implantable loop recorder to evaluate further for atrial fibrillation.  Explained that if a cardiac source is verified, we would change antithrombotic therapy.  She defers for now but will contact me if she changes her mind. Follow up 6 months.    Subjective:  Deborah Moore is a 57 year old right-handed female with hypertension, fibromuscular dysplasia, hypothyroidism, migraines, schizoaffective disorder, obsessive compulsive disorder and anxiety who presents for stroke.  History supplemented by hospital records and referring provider's notes.  She reports intermittent dizziness since hitting her head after a fall down two flights of stairs in 2002.  On 06/20/2022, she was in [place], [place] when she developed more severe dizziness with bifrontal headache, visual aura (bright lights in peripheral vision) and ataxia.  She presented to [facility] where MRI of brain revealed acute and subacute infarct in the left occipital lobe and subacute and acute punctate infarcts in the left frontal, parietal, occipital lobes and possibly left cerebellar hemisphere.  CTA head and neck revealed evidence of fibromuscular dysplasia involving the cervical carotid arteries but no intracranial or extracranial LVO or hemodynamically significant stenosis .  2D echo revealed LVEF  55-60% with negative bubble study.  Telemetry revealed no afib.  She was not on antithrombotic therapy prior to admission.  She was discharged on ASA, Plavix and maintained on simvastatin.  She followed up with cardiology here in [place].  She had a 30 day cardiac event monitor which did not reveal a fib, heart block or other sustained arrhythmia.  She also has history of occipital headaches separate from the dizziness.  They are infrequent.  Due to ongoing episodes of dizziness and headache, she went to the ED on 10/08/2022 where CT head personally reviewed showed remote right parietal infarct and mild chronic small vessel ischemic changes but no acute findings.   Current medications:  ASA 81mg  daily, Plavix 75mg  daily, atenolol, simvastatin 20mg  daily, clonazepam, Haldol, levothyroxine, sertraline  06/20/2022 CT HEAD:  No CT evidence of an acute intracranial abnormality.  Probable chronic infarcts in the parietooccipital lobes. 06/20/2022 CT PERFUSION:  No evidence of perfusion defect or mismatch.  06/20/2022 CTA HEAD & NECK:  1. No intracranial large vessel occlusion or high-grade arterial stenosis.  2.  No cervical carotid or vertebral artery stenosis.  3.  Luminal regularities of the cervical carotid arteries suggestive of fibromuscular dysplasia.  4.  Small 1 cm enhancing region in the right palatine tonsil; differential considerations include underlying lesion versus phlegmon/early abscess.  Correlate with direct visualization.   06/21/2022 MRI BRAIN:  Small acute or subacute infarct in the left occipital lobe.  There appear to be several additional punctate subacute appearing infarcts involving the left frontal lobe, left parietal lobe, left occipital lobe, and possibly within the right cerebellum; minimal chronic microvascular ischemic changes.    PAST MEDICAL HISTORY: Past Medical History:  Diagnosis Date   Anxiety    Dizziness  Fibromuscular dysplasia (HCC)    Headache    migraines    Hypertension    Hypothyroidism    Ischemic stroke ([place])     PAST SURGICAL HISTORY: Past Surgical History:  Procedure Laterality Date   COLONOSCOPY WITH PROPOFOL N/A 01/26/2016   Procedure: COLONOSCOPY WITH PROPOFOL;  Surgeon: Garlan Fair, MD;  Location: [facility];  Service: Endoscopy;  Laterality: N/A;   NO PAST SURGERIES      MEDICATIONS: Current Outpatient Medications on File Prior to Visit  Medication Sig Dispense Refill   aspirin EC 81 MG tablet Take 81 mg by mouth daily.      atenolol (TENORMIN) 25 MG tablet Take 25 mg by mouth daily with breakfast.   6   azelastine (ASTELIN) 0.1 % nasal spray Place 2 sprays into both nostrils 2 (two) times daily. Use in each nostril as directed     clonazePAM (KLONOPIN) 1 MG tablet Take 1 mg by mouth 2 (two) times daily as needed for anxiety.   5   doxycycline (VIBRAMYCIN) 100 MG capsule Take 1 capsule (100 mg total) by mouth 2 (two) times daily. 20 capsule 0   haloperidol (HALDOL) 5 MG tablet Take 5 mg by mouth at bedtime.  5   ipratropium (ATROVENT) 0.06 % nasal spray USE 2 SPRAYS INTO EACH NOSTRIL 4 TIMES A DAY  0   levothyroxine (SYNTHROID) 50 MCG tablet Take 50 mcg by mouth daily.     methimazole (TAPAZOLE) 10 MG tablet Take 10 mg by mouth daily with breakfast.   5   Multiple Vitamin (MULTIVITAMIN WITH MINERALS) TABS tablet Take 1 tablet by mouth daily. *Solgar*     predniSONE (DELTASONE) 20 MG tablet Take 2 tablets (40 mg total) by mouth daily. 10 tablet 0   sertraline (ZOLOFT) 100 MG tablet Take 100 mg by mouth every morning.  5   simvastatin (ZOCOR) 20 MG tablet Take 20 mg by mouth daily.   10   topiramate (TOPAMAX) 50 MG tablet Take 50 mg by mouth 2 (two) times daily.  5   traZODone (DESYREL) 100 MG tablet Take 100 mg by mouth at bedtime.  0   No current facility-administered medications on file prior to visit.    ALLERGIES: Allergies  Allergen Reactions   Other     FAMILY HISTORY: Family History  Problem Relation  Age of Onset   Breast cancer Neg Hx     Objective:  Blood pressure 122/81, pulse 74, height 5' (1.524 m), weight 168 lb 6.4 oz (76.4 kg), SpO2 99 %. General: No acute distress.  Patient appears well-groomed.  Flat affect Head:  Normocephalic/atraumatic Eyes:  fundi examined but not visualized Neck: supple, no paraspinal tenderness, full range of motion Back: No paraspinal tenderness Heart: regular rate and rhythm Lungs: Clear to auscultation bilaterally. Vascular: No carotid bruits. Neurological Exam: Mental status: alert and oriented to person, place, and time, speech fluent and not dysarthric, language intact. Cranial nerves: CN I: not tested CN II: pupils equal, round and reactive to light, visual fields intact CN III, IV, VI:  full range of motion, no nystagmus, no ptosis CN V: facial sensation intact. CN VII: upper and lower face symmetric CN VIII: hearing intact CN IX, X: gag intact, uvula midline CN XI: sternocleidomastoid and trapezius muscles intact CN XII: tongue midline Bulk & Tone: normal, no fasciculations. Motor:  muscle strength 5/5 throughout Sensation:  temperature and vibratory sensation intact. Deep Tendon Reflexes:  2+ throughout,  toes downgoing.   Finger  to nose testing:  Without dysmetria.   Heel to shin:  Without dysmetria.   Gait:  Normal station and stride.  Romberg negative.    Thank you for allowing me to take part in the care of this patient.  Metta Clines, DO  CC:  Jillyn Ledger, FNP  Jenkins Rouge, MD

## 2022-11-15 ENCOUNTER — Telehealth: Payer: Self-pay | Admitting: Cardiovascular Disease

## 2022-11-15 ENCOUNTER — Ambulatory Visit (INDEPENDENT_AMBULATORY_CARE_PROVIDER_SITE_OTHER): Payer: Medicare Other | Admitting: Neurology

## 2022-11-15 ENCOUNTER — Encounter: Payer: Self-pay | Admitting: Neurology

## 2022-11-15 VITALS — BP 122/81 | HR 74 | Ht 60.0 in | Wt 168.4 lb

## 2022-11-15 DIAGNOSIS — I639 Cerebral infarction, unspecified: Secondary | ICD-10-CM | POA: Diagnosis not present

## 2022-11-15 DIAGNOSIS — I773 Arterial fibromuscular dysplasia: Secondary | ICD-10-CM

## 2022-11-15 DIAGNOSIS — E785 Hyperlipidemia, unspecified: Secondary | ICD-10-CM

## 2022-11-15 DIAGNOSIS — I10 Essential (primary) hypertension: Secondary | ICD-10-CM | POA: Diagnosis not present

## 2022-11-15 NOTE — Telephone Encounter (Signed)
Patient called to follow-up on results.

## 2022-11-15 NOTE — Patient Instructions (Signed)
Continue aspirin 81mg  daily.  Stop Plavix (clopidogrel). Continue cholesterol and blood pressure medication As your stroke is suspicious for originating from the heart, I would recommend an implantable loop recorder which is a heart monitor placed under your skin over the chest in order to monitor for an arrhythmia that may be cause for stroke.  You may wear this for up to 2 years.  If we do find that arrhythmia, we would change aspirin to a different type of blood thinner.  Let me know if you would like to proceed as I would need to send you back to cardiology Otherwise, follow up in 6 months.

## 2022-11-15 NOTE — Telephone Encounter (Signed)
Pt advised her monitor results and copy sent to her neurologist Dr Tomi Likens.

## 2022-11-21 DIAGNOSIS — E890 Postprocedural hypothyroidism: Secondary | ICD-10-CM | POA: Diagnosis not present

## 2022-12-01 ENCOUNTER — Other Ambulatory Visit: Payer: Self-pay | Admitting: Family Medicine

## 2022-12-01 DIAGNOSIS — Z1231 Encounter for screening mammogram for malignant neoplasm of breast: Secondary | ICD-10-CM

## 2022-12-06 ENCOUNTER — Ambulatory Visit: Payer: Medicare Other

## 2022-12-12 DIAGNOSIS — E890 Postprocedural hypothyroidism: Secondary | ICD-10-CM | POA: Diagnosis not present

## 2022-12-12 DIAGNOSIS — E0500 Thyrotoxicosis with diffuse goiter without thyrotoxic crisis or storm: Secondary | ICD-10-CM | POA: Diagnosis not present

## 2022-12-15 DIAGNOSIS — H2513 Age-related nuclear cataract, bilateral: Secondary | ICD-10-CM | POA: Diagnosis not present

## 2022-12-21 DIAGNOSIS — F250 Schizoaffective disorder, bipolar type: Secondary | ICD-10-CM | POA: Diagnosis not present

## 2022-12-30 ENCOUNTER — Telehealth: Payer: Self-pay | Admitting: Cardiovascular Disease

## 2022-12-30 NOTE — Telephone Encounter (Signed)
Pt said, DMV revoke her license due to her heart condition. However, when she saw Dr. Johnsie Cancel, he cleared her. She requesting if Dr. Johnsie Cancel can write a letter stating she doesn't have heart condition and she can pick it up at the office.

## 2023-01-02 NOTE — Telephone Encounter (Signed)
Called patient back to let her know Dr. Johnsie Cancel is fine with writing her  letter indicating no cardiac reason for her not to drive. Patient would like to have this letter mailed to her.

## 2023-01-17 ENCOUNTER — Ambulatory Visit: Payer: Medicare Other

## 2023-02-09 DIAGNOSIS — E785 Hyperlipidemia, unspecified: Secondary | ICD-10-CM | POA: Diagnosis not present

## 2023-02-09 DIAGNOSIS — Z8673 Personal history of transient ischemic attack (TIA), and cerebral infarction without residual deficits: Secondary | ICD-10-CM | POA: Diagnosis not present

## 2023-02-09 DIAGNOSIS — E890 Postprocedural hypothyroidism: Secondary | ICD-10-CM | POA: Diagnosis not present

## 2023-02-09 DIAGNOSIS — J4540 Moderate persistent asthma, uncomplicated: Secondary | ICD-10-CM | POA: Diagnosis not present

## 2023-02-09 DIAGNOSIS — I10 Essential (primary) hypertension: Secondary | ICD-10-CM | POA: Diagnosis not present

## 2023-02-09 DIAGNOSIS — F319 Bipolar disorder, unspecified: Secondary | ICD-10-CM | POA: Diagnosis not present

## 2023-02-09 IMAGING — CR DG CHEST 2V
2 series · 2 of 2 positions shown · non-contrast
Comparison: March 30, 2018.

CLINICAL DATA: Chest pain.

EXAM:
CHEST - 2 VIEW

[w chest pa]
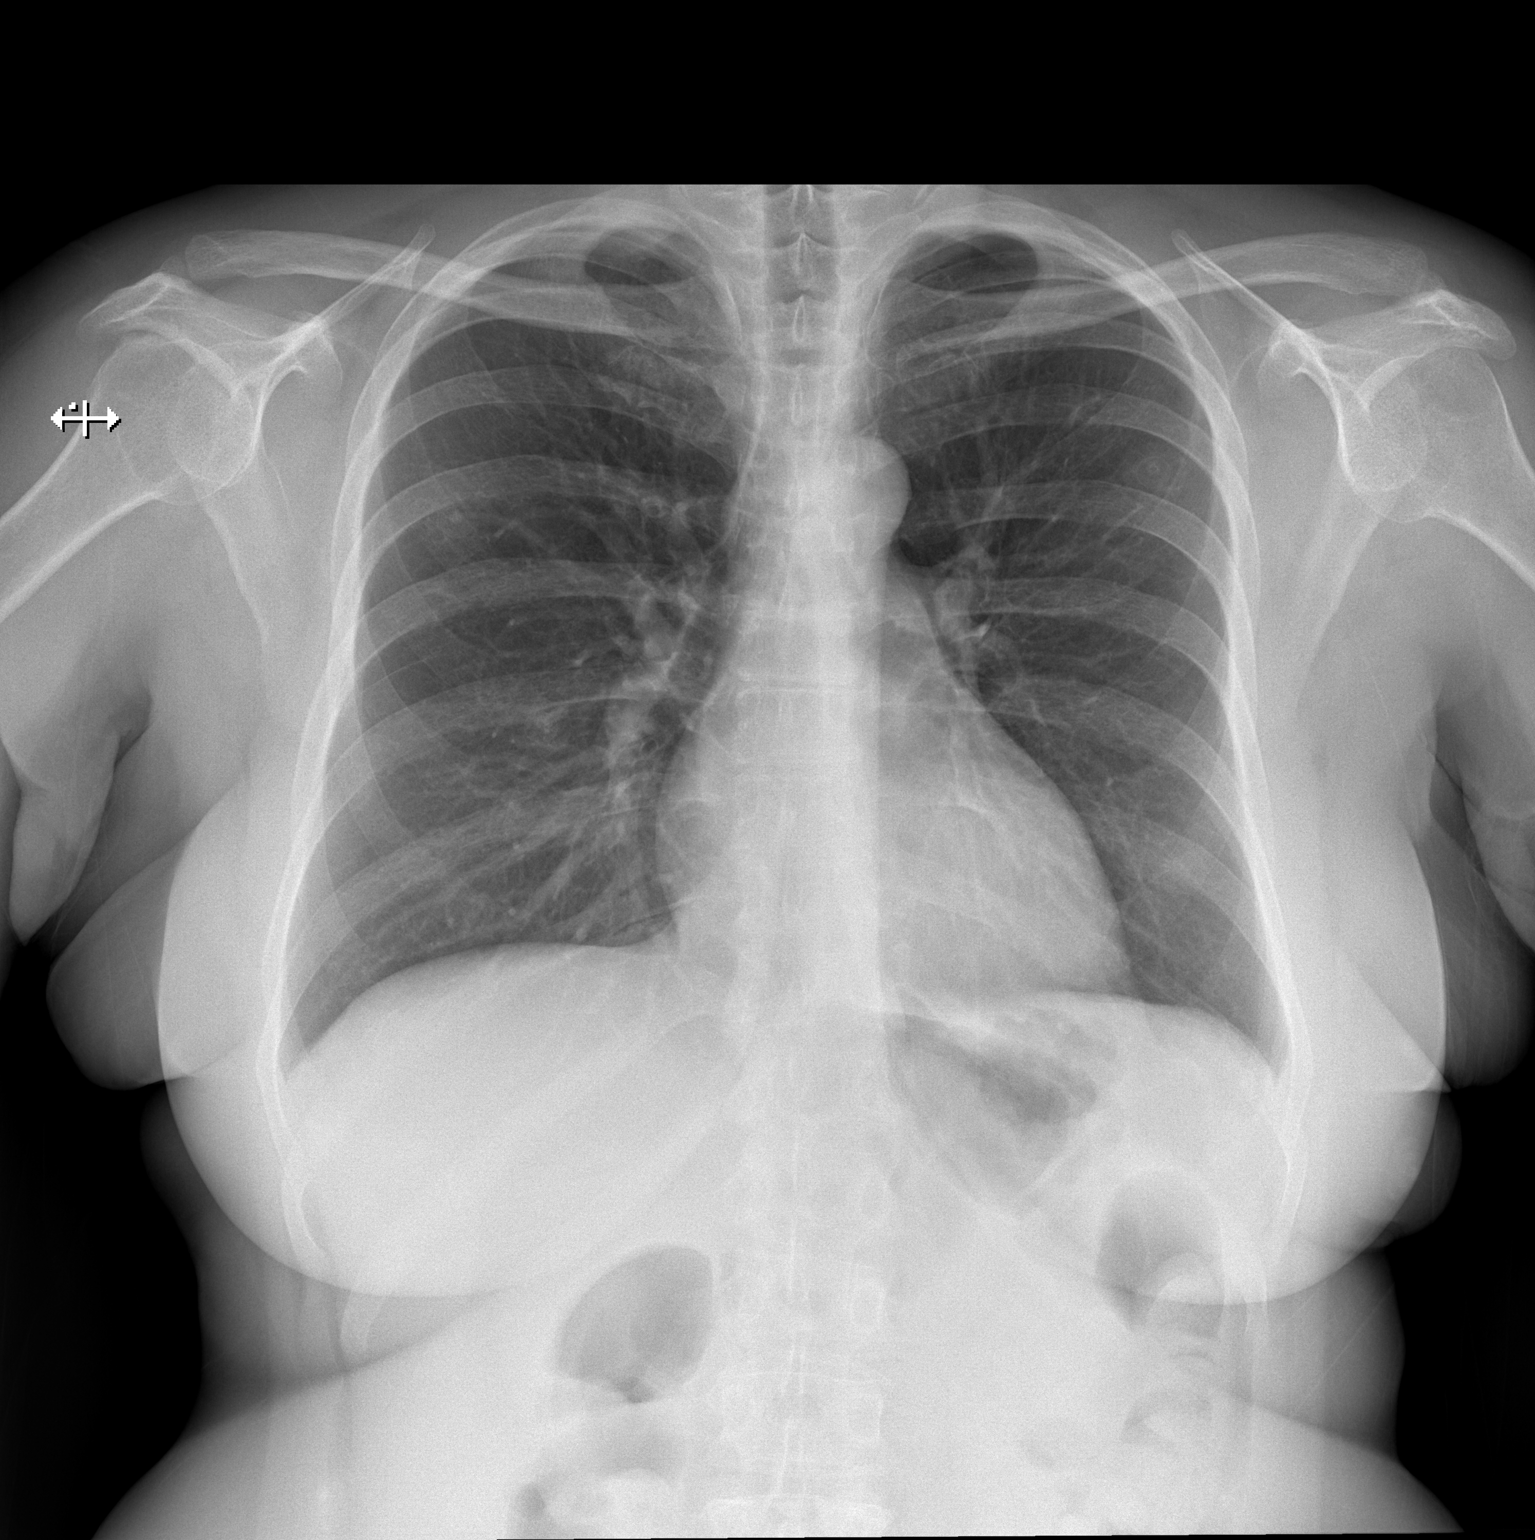

[w chest lat]
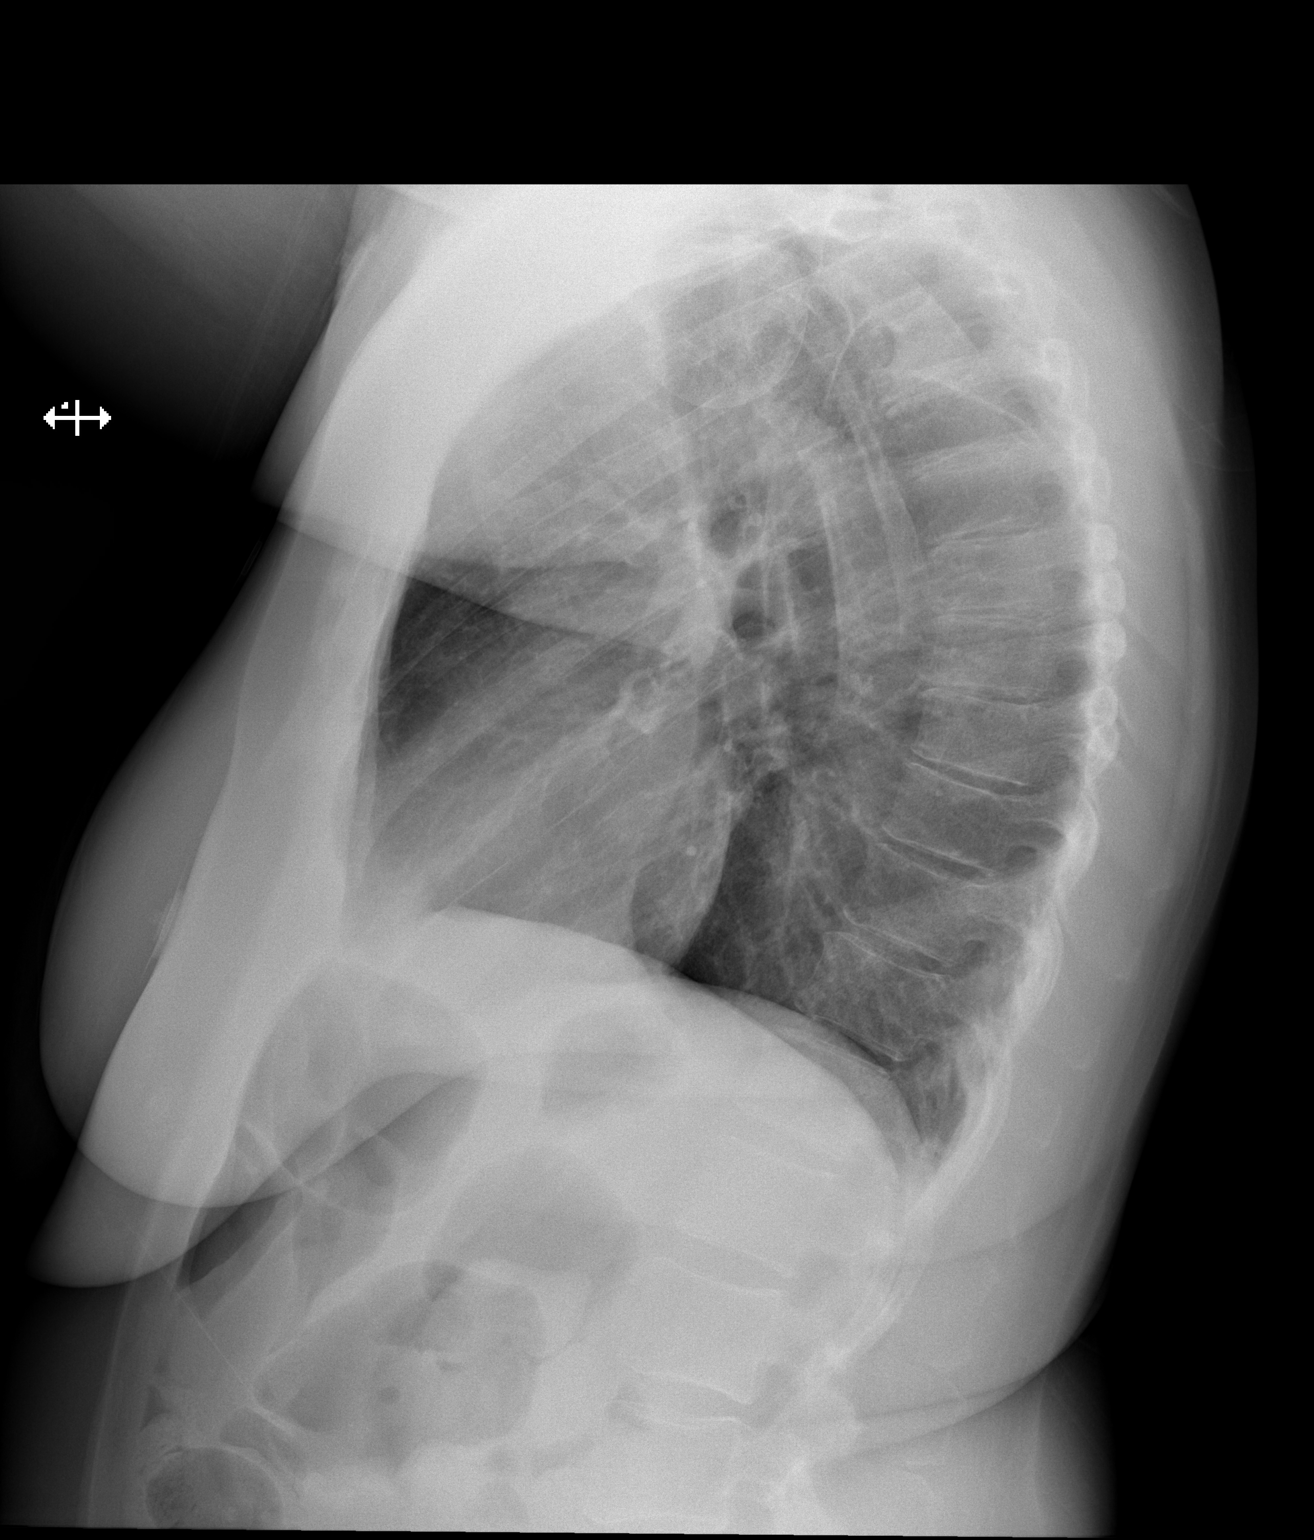

[2 of 2 positions shown; findings below may reference images not displayed]

FINDINGS: No consolidation. No visible pleural effusions or pneumothorax.
Cardiomediastinal silhouette is within normal limits. No displaced
fracture.
IMPRESSION: No evidence of acute cardiopulmonary disease.

## 2023-03-01 ENCOUNTER — Ambulatory Visit: Payer: Medicare Other

## 2023-03-07 ENCOUNTER — Ambulatory Visit: Payer: Medicare Other

## 2023-04-05 ENCOUNTER — Ambulatory Visit: Payer: Medicare Other

## 2023-04-06 ENCOUNTER — Ambulatory Visit
Admission: RE | Admit: 2023-04-06 | Discharge: 2023-04-06 | Disposition: A | Discharge status: Home / Self Care | Payer: Medicare Other | Source: Ambulatory Visit | Attending: Family Medicine | Admitting: Family Medicine

## 2023-04-06 DIAGNOSIS — Z1231 Encounter for screening mammogram for malignant neoplasm of breast: Secondary | ICD-10-CM | POA: Diagnosis not present

## 2023-04-07 DIAGNOSIS — R059 Cough, unspecified: Secondary | ICD-10-CM | POA: Diagnosis not present

## 2023-04-21 ENCOUNTER — Telehealth: Payer: Self-pay | Admitting: Neurology

## 2023-04-21 NOTE — Telephone Encounter (Signed)
Pt is calling in stating that she would like to know what the device is for and what kind it is that Dr. Everlena Cooper was wanting to place in her chest.  Pt would like to have a call back.

## 2023-04-21 NOTE — Telephone Encounter (Signed)
Patient advised to call her Cardiologist to set up a consult and discuss further with Dr.Jaffe follow up visit.

## 2023-05-10 DIAGNOSIS — F250 Schizoaffective disorder, bipolar type: Secondary | ICD-10-CM | POA: Diagnosis not present

## 2023-05-15 NOTE — Progress Notes (Deleted)
NEUROLOGY FOLLOW UP OFFICE NOTE  ICESS BERTONI [id number]  Assessment/Plan:   Left hemispheric infarcts, embolic of unknown source.  As they involve more than one vascular territory, consider cardioembolic Bilateral cervical fibromuscular dysplasia Hypertension Hyperlipidemia   Secondary stroke prevention as managed by PCP: ASA 81mg  daily Statin.  LDL goal less than 70 Hgb M5H goal less than 7 Normotensive blood pressure Recommend implantable loop recorder to evaluate further for atrial fibrillation.  Explained that if a cardiac source is verified, we would change antithrombotic therapy.  She defers for now but will contact me if she changes her mind. *** Follow up 6 months. ***       Subjective:  Averlee A Mencer is a 57 year old right-handed female with hypertension, fibromuscular dysplasia, hypothyroidism, migraines, schizoaffective disorder, obsessive compulsive disorder and anxiety who follows up for stroke.  UPDATE: Current medications:  ASA 81mg  daily, atenolol, simvastatin 20mg  daily, clonazepam, Haldol, levothyroxine, sertraline  ***   HISTORY: She reports intermittent dizziness since hitting her head after a fall down two flights of stairs in 2002.  On 06/20/2022, she was in [place], [place] when she developed more severe dizziness with bifrontal headache, visual aura (bright lights in peripheral vision) and ataxia.  She presented to [facility] where MRI of brain revealed acute and subacute infarct in the left occipital lobe and subacute and acute punctate infarcts in the left frontal, parietal, occipital lobes and possibly left cerebellar hemisphere.  CTA head and neck revealed evidence of fibromuscular dysplasia involving the cervical carotid arteries but no intracranial or extracranial LVO or hemodynamically significant stenosis .  2D echo revealed LVEF 55-60% with negative bubble study.  Telemetry revealed no afib.  She was not on antithrombotic therapy prior to  admission.  She was discharged on ASA, Plavix and maintained on simvastatin.  She followed up with cardiology here in [place].  She had a 30 day cardiac event monitor which did not reveal a fib, heart block or other sustained arrhythmia.  She also has history of occipital headaches separate from the dizziness.  They are infrequent.  Due to ongoing episodes of dizziness and headache, she went to the ED on 10/08/2022 where CT head personally reviewed showed remote right parietal infarct and mild chronic small vessel ischemic changes but no acute findings.     06/20/2022 CT HEAD:  No CT evidence of an acute intracranial abnormality.  Probable chronic infarcts in the parietooccipital lobes. 06/20/2022 CT PERFUSION:  No evidence of perfusion defect or mismatch.  06/20/2022 CTA HEAD & NECK:  1. No intracranial large vessel occlusion or high-grade arterial stenosis.  2.  No cervical carotid or vertebral artery stenosis.  3.  Luminal regularities of the cervical carotid arteries suggestive of fibromuscular dysplasia.  4.  Small 1 cm enhancing region in the right palatine tonsil; differential considerations include underlying lesion versus phlegmon/early abscess.  Correlate with direct visualization.   06/21/2022 MRI BRAIN:  Small acute or subacute infarct in the left occipital lobe.  There appear to be several additional punctate subacute appearing infarcts involving the left frontal lobe, left parietal lobe, left occipital lobe, and possibly within the right cerebellum; minimal chronic microvascular ischemic changes.  PAST MEDICAL HISTORY: Past Medical History:  Diagnosis Date   Anxiety    Dizziness    Fibromuscular dysplasia (HCC)    Headache    migraines   Hypertension    Hypothyroidism    Ischemic stroke [facility])     MEDICATIONS: Current Outpatient  Medications on File Prior to Visit  Medication Sig Dispense Refill   aspirin EC 81 MG tablet Take 81 mg by mouth daily.      atenolol (TENORMIN) 25  MG tablet Take 25 mg by mouth daily with breakfast.   6   azelastine (ASTELIN) 0.1 % nasal spray Place 2 sprays into both nostrils 2 (two) times daily. Use in each nostril as directed     clonazePAM (KLONOPIN) 1 MG tablet Take 1 mg by mouth 2 (two) times daily as needed for anxiety.   5   haloperidol (HALDOL) 5 MG tablet Take 5 mg by mouth at bedtime.  5   ipratropium (ATROVENT) 0.06 % nasal spray USE 2 SPRAYS INTO EACH NOSTRIL 4 TIMES A DAY  0   levothyroxine (SYNTHROID) 50 MCG tablet Take 50 mcg by mouth daily.     Multiple Vitamin (MULTIVITAMIN WITH MINERALS) TABS tablet Take 1 tablet by mouth daily. *Solgar*     sertraline (ZOLOFT) 100 MG tablet Take 100 mg by mouth every morning.  5   simvastatin (ZOCOR) 20 MG tablet Take 20 mg by mouth daily.   10   No current facility-administered medications on file prior to visit.    ALLERGIES: Allergies  Allergen Reactions   Other     FAMILY HISTORY: Family History  Problem Relation Age of Onset   Stroke Mother    Dementia Mother    Stroke Father    Breast cancer Neg Hx       Objective:  *** General: No acute distress.  Patient appears well-groomed.   Head:  Normocephalic/atraumatic Eyes:  Fundi examined but not visualized Neck: supple, no paraspinal tenderness, full range of motion Heart:  Regular rate and rhythm Neurological Exam: alert and oriented.  Speech fluent and not dysarthric, language intact.  CN II-XII intact. Bulk and tone normal, muscle strength 5/5 throughout.  Sensation to light touch intact.  Deep tendon reflexes 2+ throughout, toes downgoing.  Finger to nose testing intact.  Gait normal, Romberg negative.   Shon Millet, DO  CC: Peri Maris, FNP

## 2023-05-16 ENCOUNTER — Encounter: Payer: Self-pay | Admitting: Neurology

## 2023-05-16 ENCOUNTER — Ambulatory Visit: Payer: Medicare Other | Admitting: Neurology

## 2023-05-16 DIAGNOSIS — Z029 Encounter for administrative examinations, unspecified: Secondary | ICD-10-CM

## 2023-06-06 NOTE — Progress Notes (Deleted)
 NEUROLOGY FOLLOW UP OFFICE NOTE  Deborah Moore [id number]  Assessment/Plan:   Left hemispheric infarcts, embolic of unknown source.  As they involve more than one vascular territory, consider cardioembolic Bilateral cervical fibromuscular dysplasia Hypertension Hyperlipidemia   Secondary stroke prevention as managed by PCP: ASA 81mg  daily Statin.  LDL goal less than 70 Hgb M5H goal less than 7 Normotensive blood pressure Recommend implantable loop recorder to evaluate further for atrial fibrillation.  Explained that if a cardiac source is verified, we would change antithrombotic therapy.  She defers for now but will contact me if she changes her mind. *** Follow up 6 months. ***       Subjective:  Deborah Moore is a 57 year old right-handed female with hypertension, fibromuscular dysplasia, hypothyroidism, migraines, schizoaffective disorder, obsessive compulsive disorder and anxiety who follows up for stroke.  UPDATE: Current medications:  ASA 81mg  daily, atenolol, simvastatin 20mg  daily, clonazepam, Haldol, levothyroxine, sertraline  ***   HISTORY: She reports intermittent dizziness since hitting her head after a fall down two flights of stairs in 2002.  On 06/20/2022, she was in [place], [place] when she developed more severe dizziness with bifrontal headache, visual aura (bright lights in peripheral vision) and ataxia.  She presented to [facility] where MRI of brain revealed acute and subacute infarct in the left occipital lobe and subacute and acute punctate infarcts in the left frontal, parietal, occipital lobes and possibly left cerebellar hemisphere.  CTA head and neck revealed evidence of fibromuscular dysplasia involving the cervical carotid arteries but no intracranial or extracranial LVO or hemodynamically significant stenosis .  2D echo revealed LVEF 55-60% with negative bubble study.  Telemetry revealed no afib.  She was not on antithrombotic therapy prior to  admission.  She was discharged on ASA, Plavix and maintained on simvastatin.  She followed up with cardiology here in [place].  She had a 30 day cardiac event monitor which did not reveal a fib, heart block or other sustained arrhythmia.  She also has history of occipital headaches separate from the dizziness.  They are infrequent.  Due to ongoing episodes of dizziness and headache, she went to the ED on 10/08/2022 where CT head personally reviewed showed remote right parietal infarct and mild chronic small vessel ischemic changes but no acute findings.     06/20/2022 CT HEAD:  No CT evidence of an acute intracranial abnormality.  Probable chronic infarcts in the parietooccipital lobes. 06/20/2022 CT PERFUSION:  No evidence of perfusion defect or mismatch.  06/20/2022 CTA HEAD & NECK:  1. No intracranial large vessel occlusion or high-grade arterial stenosis.  2.  No cervical carotid or vertebral artery stenosis.  3.  Luminal regularities of the cervical carotid arteries suggestive of fibromuscular dysplasia.  4.  Small 1 cm enhancing region in the right palatine tonsil; differential considerations include underlying lesion versus phlegmon/early abscess.  Correlate with direct visualization.   06/21/2022 MRI BRAIN:  Small acute or subacute infarct in the left occipital lobe.  There appear to be several additional punctate subacute appearing infarcts involving the left frontal lobe, left parietal lobe, left occipital lobe, and possibly within the right cerebellum; minimal chronic microvascular ischemic changes.  PAST MEDICAL HISTORY: Past Medical History:  Diagnosis Date   Anxiety    Dizziness    Fibromuscular dysplasia (HCC)    Headache    migraines   Hypertension    Hypothyroidism    Ischemic stroke [facility])     MEDICATIONS: Current Outpatient  Medications on File Prior to Visit  Medication Sig Dispense Refill   aspirin EC 81 MG tablet Take 81 mg by mouth daily.      atenolol (TENORMIN) 25  MG tablet Take 25 mg by mouth daily with breakfast.   6   azelastine (ASTELIN) 0.1 % nasal spray Place 2 sprays into both nostrils 2 (two) times daily. Use in each nostril as directed     clonazePAM (KLONOPIN) 1 MG tablet Take 1 mg by mouth 2 (two) times daily as needed for anxiety.   5   haloperidol (HALDOL) 5 MG tablet Take 5 mg by mouth at bedtime.  5   ipratropium (ATROVENT) 0.06 % nasal spray USE 2 SPRAYS INTO EACH NOSTRIL 4 TIMES A DAY  0   levothyroxine (SYNTHROID) 50 MCG tablet Take 50 mcg by mouth daily.     Multiple Vitamin (MULTIVITAMIN WITH MINERALS) TABS tablet Take 1 tablet by mouth daily. *Solgar*     sertraline (ZOLOFT) 100 MG tablet Take 100 mg by mouth every morning.  5   simvastatin (ZOCOR) 20 MG tablet Take 20 mg by mouth daily.   10   No current facility-administered medications on file prior to visit.    ALLERGIES: Allergies  Allergen Reactions   Other     FAMILY HISTORY: Family History  Problem Relation Age of Onset   Stroke Mother    Dementia Mother    Stroke Father    Breast cancer Neg Hx       Objective:  *** General: No acute distress.  Patient appears well-groomed.   Head:  Normocephalic/atraumatic Eyes:  Fundi examined but not visualized Neck: supple, no paraspinal tenderness, full range of motion Heart:  Regular rate and rhythm Neurological Exam: alert and oriented.  Speech fluent and not dysarthric, language intact.  CN II-XII intact. Bulk and tone normal, muscle strength 5/5 throughout.  Sensation to light touch intact.  Deep tendon reflexes 2+ throughout, toes downgoing.  Finger to nose testing intact.  Gait normal, Romberg negative.   Shon Millet, DO  CC: Peri Maris, FNP

## 2023-06-07 ENCOUNTER — Encounter: Payer: Self-pay | Admitting: Neurology

## 2023-06-07 ENCOUNTER — Ambulatory Visit: Payer: Medicare Other | Admitting: Neurology

## 2023-06-07 DIAGNOSIS — Z029 Encounter for administrative examinations, unspecified: Secondary | ICD-10-CM

## 2023-06-19 ENCOUNTER — Ambulatory Visit: Payer: Medicare Other | Admitting: Cardiology

## 2023-08-15 NOTE — Progress Notes (Unsigned)
NEUROLOGY FOLLOW UP OFFICE NOTE  Deborah Moore [id number]  Assessment/Plan:   Left hemispheric infarcts, embolic of unknown source.  As they involve more than one vascular territory, consider cardioembolic Bilateral cervical fibromuscular dysplasia Hypertension Hyperlipidemia Vertigo as late effect of CVA - off and on for over 20 years, worse since the stroke.  Secondary stroke prevention as managed by PCP: ASA 81mg  daily Statin.  LDL goal less than 70 Hgb P2R goal less than 7 Normotensive blood pressure Recommend following up with her cardiologist, Dr. Eden Emms.  Due to high concern for cardioembolic source, I recommend implantable loop recorder to evaluate further for atrial fibrillation.  I will send him my note. Due to ongoing gait instability and dizziness, refer to PT for gait instability and vestibular rehab Follow up 6 months.  Total time spent in the chart and face to face with patient:  41 minutes.  Subjective:  Deborah Moore is a 57 year old right-handed female with hypertension, fibromuscular dysplasia, hypothyroidism, migraines, schizoaffective disorder, obsessive compulsive disorder and anxiety who follows up for stroke.  UPDATE: Current medications:  ASA 81mg  daily, Plavix 75mg  daily, atenolol, simvastatin 20mg  daily, clonazepam, Haldol, levothyroxine, sertraline   Overall doing well.  Although improved, she still struggles with vertigo.  Aggravated when she stands up.  Due to falls, she wears a helmet to prevent head injury.    HISTORY: She reports intermittent dizziness since hitting her head after a fall down two flights of stairs in 2002.  On 06/20/2022, she was in [place], [place] when she developed more severe dizziness with bifrontal headache, visual aura (bright lights in peripheral vision) and ataxia.  She presented to [facility] where MRI of brain revealed acute and subacute infarct in the left occipital lobe and subacute and acute punctate infarcts  in the left frontal, parietal, occipital lobes and possibly left cerebellar hemisphere.  CTA head and neck revealed evidence of fibromuscular dysplasia involving the cervical carotid arteries but no intracranial or extracranial LVO or hemodynamically significant stenosis .  2D echo revealed LVEF 55-60% with negative bubble study.  Telemetry revealed no afib.  She was not on antithrombotic therapy prior to admission.  She was discharged on ASA, Plavix and maintained on simvastatin.  She followed up with cardiology here in [place].  She had a 30 day cardiac event monitor from 11/27-12/26/2023 which did not reveal a fib, heart block or other sustained arrhythmia.  She also has history of occipital headaches separate from the dizziness.  They are infrequent.  Due to ongoing episodes of dizziness and headache, she went to the ED on 10/08/2022 where CT head personally reviewed showed remote right parietal infarct and mild chronic small vessel ischemic changes but no acute findings.  She followed up with her cardiologist.    06/20/2022 CT HEAD:  No CT evidence of an acute intracranial abnormality.  Probable chronic infarcts in the parietooccipital lobes. 06/20/2022 CT PERFUSION:  No evidence of perfusion defect or mismatch.  06/20/2022 CTA HEAD & NECK:  1. No intracranial large vessel occlusion or high-grade arterial stenosis.  2.  No cervical carotid or vertebral artery stenosis.  3.  Luminal regularities of the cervical carotid arteries suggestive of fibromuscular dysplasia.  4.  Small 1 cm enhancing region in the right palatine tonsil; differential considerations include underlying lesion versus phlegmon/early abscess.  Correlate with direct visualization.   06/21/2022 MRI BRAIN:  Small acute or subacute infarct in the left occipital lobe.  There appear to be  several additional punctate subacute appearing infarcts involving the left frontal lobe, left parietal lobe, left occipital lobe, and possibly within the  right cerebellum; minimal chronic microvascular ischemic changes.  PAST MEDICAL HISTORY: Past Medical History:  Diagnosis Date   Anxiety    Dizziness    Fibromuscular dysplasia (HCC)    Headache    migraines   Hypertension    Hypothyroidism    Ischemic stroke [facility])     MEDICATIONS: Current Outpatient Medications on File Prior to Visit  Medication Sig Dispense Refill   aspirin EC 81 MG tablet Take 81 mg by mouth daily.      atenolol (TENORMIN) 25 MG tablet Take 25 mg by mouth daily with breakfast.   6   azelastine (ASTELIN) 0.1 % nasal spray Place 2 sprays into both nostrils 2 (two) times daily. Use in each nostril as directed     clonazePAM (KLONOPIN) 1 MG tablet Take 1 mg by mouth 2 (two) times daily as needed for anxiety.   5   haloperidol (HALDOL) 5 MG tablet Take 5 mg by mouth at bedtime.  5   ipratropium (ATROVENT) 0.06 % nasal spray USE 2 SPRAYS INTO EACH NOSTRIL 4 TIMES A DAY  0   levothyroxine (SYNTHROID) 50 MCG tablet Take 50 mcg by mouth daily.     Multiple Vitamin (MULTIVITAMIN WITH MINERALS) TABS tablet Take 1 tablet by mouth daily. *Solgar*     sertraline (ZOLOFT) 100 MG tablet Take 100 mg by mouth every morning.  5   simvastatin (ZOCOR) 20 MG tablet Take 20 mg by mouth daily.   10   No current facility-administered medications on file prior to visit.    ALLERGIES: Allergies  Allergen Reactions   Other     FAMILY HISTORY: Family History  Problem Relation Age of Onset   Stroke Mother    Dementia Mother    Stroke Father    Breast cancer Neg Hx       Objective:  Blood pressure 107/73, pulse 93, resp. rate 18, weight 154 lb (69.9 kg), SpO2 99%. General: No acute distress.  Patient appears well-groomed.   Head:  Normocephalic/atraumatic Eyes:  Fundi examined but not visualized Neck: supple, no paraspinal tenderness, full range of motion Heart:  Regular rate and rhythm Neurological Exam: alert and oriented.  Speech fluent and not dysarthric, language  intact.  CN II-XII intact. Bulk and tone normal, muscle strength 5/5 throughout.  Sensation to pinprick and vibration intact.  Deep tendon reflexes 2+ throughout.  Finger to nose testing intact.  Gait cautious and mildly wide-based.  Able to turn.  Some difficulty with tandem walk.  Romberg negative.    Shon Millet, DO  CC: Lynett Grimes, FNP

## 2023-08-16 ENCOUNTER — Telehealth: Payer: Self-pay

## 2023-08-16 ENCOUNTER — Ambulatory Visit (INDEPENDENT_AMBULATORY_CARE_PROVIDER_SITE_OTHER): Payer: BC Managed Care – PPO | Admitting: Neurology

## 2023-08-16 ENCOUNTER — Encounter: Payer: Self-pay | Admitting: Neurology

## 2023-08-16 VITALS — BP 107/73 | HR 93 | Resp 18 | Wt 154.0 lb

## 2023-08-16 DIAGNOSIS — I639 Cerebral infarction, unspecified: Secondary | ICD-10-CM

## 2023-08-16 DIAGNOSIS — E785 Hyperlipidemia, unspecified: Secondary | ICD-10-CM

## 2023-08-16 DIAGNOSIS — I6381 Other cerebral infarction due to occlusion or stenosis of small artery: Secondary | ICD-10-CM

## 2023-08-16 DIAGNOSIS — R2681 Unsteadiness on feet: Secondary | ICD-10-CM

## 2023-08-16 DIAGNOSIS — R42 Dizziness and giddiness: Secondary | ICD-10-CM

## 2023-08-16 DIAGNOSIS — I773 Arterial fibromuscular dysplasia: Secondary | ICD-10-CM

## 2023-08-16 DIAGNOSIS — I10 Essential (primary) hypertension: Secondary | ICD-10-CM | POA: Diagnosis not present

## 2023-08-16 NOTE — Patient Instructions (Signed)
Continue current medications, including aspirin 81mg  daily Please follow up with Dr. Eden Emms to discuss implantable loop recorder.  I will send him a note as well. Refer to physical therapy for gait instability and vestibular rehab Follow up 6 months.

## 2023-08-16 NOTE — Telephone Encounter (Signed)
-----   Message from Charlton Haws sent at 08/16/2023  5:23 PM EDT ----- Her monitor earlier this year had no PAF will arrange for her to see EP for placement  Pam, can you put referral in ----- Message ----- From: Drema Dallas, DO Sent: 08/16/2023   4:20 PM EDT To: Wendall Stade, MD  Dr. Eden Emms, I think that Ms. Ardinger would benefit from an implantable loop recorder as her stroke seems highly suspect for cardioembolic source.  Madelaine Bhat

## 2023-08-16 NOTE — Telephone Encounter (Signed)
Referral placed.

## 2023-08-17 ENCOUNTER — Telehealth: Payer: Self-pay

## 2023-08-17 DIAGNOSIS — I639 Cerebral infarction, unspecified: Secondary | ICD-10-CM

## 2023-08-18 NOTE — Telephone Encounter (Signed)
Left message for patient to call back to let her know about referral placed.

## 2023-08-21 DIAGNOSIS — I10 Essential (primary) hypertension: Secondary | ICD-10-CM | POA: Diagnosis not present

## 2023-08-21 DIAGNOSIS — J4540 Moderate persistent asthma, uncomplicated: Secondary | ICD-10-CM | POA: Diagnosis not present

## 2023-08-21 DIAGNOSIS — N1831 Chronic kidney disease, stage 3a: Secondary | ICD-10-CM | POA: Diagnosis not present

## 2023-08-21 DIAGNOSIS — Z23 Encounter for immunization: Secondary | ICD-10-CM | POA: Diagnosis not present

## 2023-09-07 NOTE — Progress Notes (Deleted)
Cardiology Office Note    Patient Name: Deborah Moore Date of Encounter: 09/07/2023  Primary Care Provider:  Soundra Pilon, FNP Primary Cardiologist:  Charlton Haws, MD Primary Electrophysiologist: None   Past Medical History    Past Medical History:  Diagnosis Date   Anxiety    Dizziness    Fibromuscular dysplasia (HCC)    Headache    migraines   Hypertension    Hypothyroidism    Ischemic stroke (HCC)     History of Present Illness  Deborah Moore is a 57 y.o. female with a PMH of HTN, hypothyroidism, schizoaffective disorder, ischemic stroke 05/2022 with punctate subacute parietal lesions left occipital infarct who presents for overdue follow-up.  [name] was seen initially by Dr. Eden Emms on 09/02/2022 following hospitalization in [place] for ischemic stroke.  She was discharged with ASA and Plavix and did not receive tPA.  2D echo was completed showing EF of 55 to 60% no foramen ovale noted with bubble study.  CT of the head revealed small acute and subacute infarcts within the left occipital lobe with additional punctate infarcts involving the left frontal lobe.  During follow-up visit patient was ordered 30-day monitor to rule out PAF that revealed no arrhythmias.  She was seen for interim ED visit on 10/08/2022 for headache.  She had a CT of the head that showed no acute intracranial abnormalities or indication for MRI.  She is currently followed by neurology with Dr. Everlena Cooper and was last seen 08/16/2023 with recommendation to have implantable loop recorder to evaluate for possible cardioembolic source of previous stroke.  She was also referred to PT due to gait instability and dizziness.  She had referral placed to EP for planned implant of loop recorder.  During today's visit the patient reports*** .  Patient denies chest pain, palpitations, dyspnea, PND, orthopnea, nausea, vomiting, dizziness, syncope, edema, weight gain, or early satiety.  ***Notes: -Last ischemic  evaluation: -Last echo: -Interim ED visits: Review of Systems  Please see the history of present illness.    All other systems reviewed and are otherwise negative except as noted above.  Physical Exam    Wt Readings from Last 3 Encounters:  08/16/23 154 lb (69.9 kg)  11/15/22 168 lb 6.4 oz (76.4 kg)  09/02/22 171 lb 3.2 oz (77.7 kg)   ZO:XWRUE were no vitals filed for this visit.,There is no height or weight on file to calculate BMI. GEN: Well nourished, well developed in no acute distress Neck: No JVD; No carotid bruits Pulmonary: Clear to auscultation without rales, wheezing or rhonchi  Cardiovascular: Normal rate. Regular rhythm. Normal S1. Normal S2.   Murmurs: There is no murmur.  ABDOMEN: Soft, non-tender, non-distended EXTREMITIES:  No edema; No deformity   EKG/LABS/ Recent Cardiac Studies   ECG personally reviewed by me today - ***  Risk Assessment/Calculations:   {Does this patient have ATRIAL FIBRILLATION?:[phone]}      Lab Results  Component Value Date   WBC 6.6 10/08/2022   HGB 12.2 10/08/2022   HCT 38.7 10/08/2022   MCV 97.5 10/08/2022   PLT 206 10/08/2022   Lab Results  Component Value Date   CREATININE 1.06 (H) 10/08/2022   BUN 23 (H) 10/08/2022   NA 138 10/08/2022   K 4.2 10/08/2022   CL 106 10/08/2022   CO2 21 (L) 10/08/2022   No results found for: "CHOL", "HDL", "LDLCALC", "LDLDIRECT", "TRIG", "CHOLHDL"  No results found for: "HGBA1C" Assessment & Plan    1.  History of CVA:  2.  Essential hypertension  3.  Hyperlipidemia  4.  Schizoaffective disorder      Disposition: Follow-up with Charlton Haws, MD or APP in *** months {Are you ordering a CV Procedure (e.g. stress test, cath, DCCV, TEE, etc)?   Press F2        :[id number]}   Signed, Napoleon Form, Leodis Rains, NP 09/07/2023, 8:48 AM [facility]

## 2023-09-08 ENCOUNTER — Ambulatory Visit: Payer: Medicare Other | Admitting: Nurse Practitioner

## 2023-09-08 DIAGNOSIS — I6381 Other cerebral infarction due to occlusion or stenosis of small artery: Secondary | ICD-10-CM

## 2023-09-08 DIAGNOSIS — E782 Mixed hyperlipidemia: Secondary | ICD-10-CM

## 2023-09-08 DIAGNOSIS — I10 Essential (primary) hypertension: Secondary | ICD-10-CM

## 2023-09-08 DIAGNOSIS — F259 Schizoaffective disorder, unspecified: Secondary | ICD-10-CM

## 2023-10-11 ENCOUNTER — Ambulatory Visit: Payer: Medicare Other | Admitting: Cardiology

## 2023-10-24 ENCOUNTER — Ambulatory Visit: Payer: BC Managed Care – PPO | Attending: Cardiology | Admitting: Cardiology

## 2023-10-24 VITALS — BP 106/74 | HR 94

## 2023-10-24 DIAGNOSIS — I10 Essential (primary) hypertension: Secondary | ICD-10-CM | POA: Diagnosis not present

## 2023-10-24 DIAGNOSIS — I6381 Other cerebral infarction due to occlusion or stenosis of small artery: Secondary | ICD-10-CM | POA: Diagnosis not present

## 2023-10-24 NOTE — Progress Notes (Signed)
 Electrophysiology Office Note:    Date:  10/24/2023   ID:  [name], Deborah 1965/11/08, MRN [id number]  [facility] Cardiologist:  Maude Emmer, MD  [facility] Electrophysiologist:  OLE ONEIDA HOLTS, MD   Referring MD: Marvene Prentice SAUNDERS, FNP   Chief Complaint: Cryptogenic stroke  History of Present Illness:    [name] is a 57 year old woman who I am seeing today for an evaluation of cryptogenic stroke at the request of [name]. The patient was last seen by neurology in clinic August 16, 2023.  The patient has a history of hypertension, hyperlipidemia, bilateral cervical fibromuscular dysplasia.  She is on aspirin 81 mg by mouth once daily and statin.  At the appointment with neurology, implantable loop recorder was recommended given high concern for cardioembolic stroke source.  The patient is here with her husband in clinic.  He confirms the above history.     Their past medical, social and family history was reviewed.   ROS:   Please see the history of present illness.    All other systems reviewed and are negative.  EKGs/Labs/Other Studies Reviewed:    The following studies were reviewed today:  October 31, 2022 monitor personally reviewed  No atrial fibrillation  All EKGs reviewed and showed no evidence of atrial fibrillation.      Physical Exam:    VS:  BP 106/74   Pulse 94   SpO2 96%     Wt Readings from Last 3 Encounters:  08/16/23 154 lb (69.9 kg)  11/15/22 168 lb 6.4 oz (76.4 kg)  09/02/22 171 lb 3.2 oz (77.7 kg)     GEN: no distress.  Blunted affect. CARD: RRR, No MRG RESP: No IWOB. CTAB.        ASSESSMENT AND PLAN:    1. 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 Primary hypertension      #Cryptogenic Stroke Pathophysiology of cryptogenic stroke was discussed in detail during today's clinic appointment. I discussed the role of loop recorder monitoring in patients who have suffered a CVA/TIA.  There has been no evidence of AF thus far in the patient's evaluation. Loop recorder monitors were discussed in detail including the implant procedure and its risks. I discussed the monthly monitoring costs associated with loop recorder monitoring. The patient would like to proceed with ILR implant.  #Hypertension At goal today.  Recommend checking blood pressures 1-2 times per week at home and recording the values.  Recommend bringing these recordings to the primary care physician.      Signed, Ole ONEIDA. Holts, MD, [facility], [facility] 10/24/2023 2:04 PM    Electrophysiology [facility]  --------------------  SURGEON:  OLE ONEIDA HOLTS, MD     PREPROCEDURE DIAGNOSIS:  Cryptogenic stroke    POSTPROCEDURE DIAGNOSIS: Cryptogenic stroke     PROCEDURES:   1. Implantable loop recorder implantation    INTRODUCTION:  [name] presents with a history of cryptogenic stroke The costs of loop recorder monitoring have been discussed with the patient.    DESCRIPTION OF PROCEDURE:  Informed written consent was obtained.  A preprocedural timeout was performed with the RN [place]). The patient required no sedation for the procedure today.  Mapping over the patient's chest was performed to identify the area where electrograms were most prominent for ILR recording.  This area was found to be the left parasternal region over the 4th intercostal space. The patients left chest was therefore prepped and  draped in the usual sterile fashion. The skin overlying the left parasternal region was infiltrated with lidocaine  for local analgesia.  A 0.5-cm incision was made over the left parasternal region over the 3rd intercostal space.  A subcutaneous ILR pocket was fashioned using a combination of sharp and blunt dissection.  A Abbott Assert IQ EL+ ([id number]) implantable loop recorder was then placed into the pocket  R waves were very prominent and measured >0.2mV.  Steri- Strips and a sterile  dressing were then applied.  There were no early apparent complications.     CONCLUSIONS:   1. Successful implantation of a implantable loop recorder for Cryptogenic stroke  2. No early apparent complications.   Ole T. Cindie, MD, [facility], [facility] Cardiac Electrophysiology

## 2023-10-24 NOTE — Patient Instructions (Signed)
 Medication Instructions:  Your physician recommends that you continue on your current medications as directed. Please refer to the Current Medication list given to you today.  Labwork: None ordered.  Testing/Procedures: None ordered.  Follow-Up:  As needed with Dr. Cindie   Implantable Loop Recorder Placement, Care After This sheet gives you information about how to care for yourself after your procedure. Your health care provider may also give you more specific instructions. If you have problems or questions, contact your health care provider. What can I expect after the procedure? After the procedure, it is common to have: Soreness or discomfort near the incision. Some swelling or bruising near the incision.  Follow these instructions at home: Incision care  Monitor your cardiac device site for redness, swelling, and drainage. Call the device clinic at [phone] if you experience these symptoms or fever/chills.  Keep the large square bandage on your site for 24 hours and then you may remove it yourself. Keep the steri-strips underneath in place.   You may shower after 72 hours / 3 days from your procedure with the steri-strips in place. They will usually fall off on their own, or may be removed after 10 days. Pat dry.   Avoid lotions, ointments, or perfumes over your incision until it is well-healed.  Please do not submerge in water until your site is completely healed.   Your device is MRI compatible.   Remote monitoring is used to monitor your cardiac device from home. This monitoring is scheduled every month by our office. It allows us  to keep an eye on the function of your device to ensure it is working properly.  If your wound site starts to bleed apply pressure.       If you have any questions/concerns please call the device clinic at [phone].  Activity  Return to your normal activities.  General instructions Follow instructions from your health care  provider about how to manage your implantable loop recorder and transmit the information. Learn how to activate a recording if this is necessary for your type of device. You may go through a metal detection gate, and you may let someone hold a metal detector over your chest. Show your ID card if needed. Do not have an MRI unless you check with your health care provider first. Take over-the-counter and prescription medicines only as told by your health care provider. Keep all follow-up visits as told by your health care provider. This is important. Contact a health care provider if: You have redness, swelling, or pain around your incision. You have a fever. You have pain that is not relieved by your pain medicine. You have triggered your device because of fainting (syncope) or because of a heartbeat that feels like it is racing, slow, fluttering, or skipping (palpitations). Get help right away if you have: Chest pain. Difficulty breathing. Summary After the procedure, it is common to have soreness or discomfort near the incision. Change your dressing as told by your health care provider. Follow instructions from your health care provider about how to manage your implantable loop recorder and transmit the information. Keep all follow-up visits as told by your health care provider. This is important. This information is not intended to replace advice given to you by your health care provider. Make sure you discuss any questions you have with your health care provider. Document Released: 09/21/2015 Document Revised: 11/25/2017 Document Reviewed: 11/25/2017 Elsevier Patient Education  2020 [employer].

## 2023-11-07 ENCOUNTER — Encounter: Payer: Self-pay | Admitting: Neurology

## 2023-11-12 NOTE — Progress Notes (Unsigned)
Cardiology Office Note    Patient Name: [name] Date of Encounter: 11/12/2023  Primary Care Provider:  Soundra Pilon, FNP Primary Cardiologist:  Charlton Haws, MD Primary Electrophysiologist: Lanier Prude, MD   Past Medical History    Past Medical History:  Diagnosis Date   Anxiety    Dizziness    Fibromuscular dysplasia (HCC)    Headache    migraines   Hypertension    Hypothyroidism    Ischemic stroke [facility])     History of Present Illness  Deborah Moore is a 58 y.o. female with a PMH of HTN, hypothyroidism, schizoaffective disorder, ischemic stroke 05/2022 with punctate subacute parietal lesions left occipital infarct who presents for overdue follow-up.   [name] was seen initially by Dr. Eden Emms on 09/02/2022 following hospitalization in [place] for ischemic stroke.  She was discharged with ASA and Plavix and did not receive tPA.  2D echo was completed showing EF of 55 to 60% no foramen ovale noted with bubble study.  CT of the head revealed small acute and subacute infarcts within the left occipital lobe with additional punctate infarcts involving the left frontal lobe.  During follow-up visit patient was ordered 30-day monitor to rule out PAF that revealed no arrhythmias.  She was seen for interim ED visit on 10/08/2022 for headache.  She had a CT of the head that showed no acute intracranial abnormalities or indication for MRI.  She is currently followed by neurology with Dr. Everlena Cooper and was last seen 08/16/2023 with recommendation to have implantable loop recorder to evaluate for possible cardioembolic source of previous stroke.  She was also referred to PT due to gait instability and dizziness.  She had referral placed to EP for planned implant of loop recorder.   During today's visit the patient reports*** .  Patient denies chest pain, palpitations, dyspnea, PND, orthopnea, nausea, vomiting, dizziness, syncope, edema, weight gain, or early satiety. is a 58 y.o. female  with a PMH of***   ***Notes: -Last ischemic evaluation: -Last echo: -Interim ED visits: Review of Systems  Please see the history of present illness.    All other systems reviewed and are otherwise negative except as noted above.  Physical Exam    Wt Readings from Last 3 Encounters:  08/16/23 154 lb (69.9 kg)  11/15/22 168 lb 6.4 oz (76.4 kg)  09/02/22 171 lb 3.2 oz (77.7 kg)   ZO:XWRUE were no vitals filed for this visit.,There is no height or weight on file to calculate BMI. GEN: Well nourished, well developed in no acute distress Neck: No JVD; No carotid bruits Pulmonary: Clear to auscultation without rales, wheezing or rhonchi  Cardiovascular: Normal rate. Regular rhythm. Normal S1. Normal S2.   Murmurs: There is no murmur.  ABDOMEN: Soft, non-tender, non-distended EXTREMITIES:  No edema; No deformity   EKG/LABS/ Recent Cardiac Studies   ECG personally reviewed by me today - ***  Risk Assessment/Calculations:   {Does this patient have ATRIAL FIBRILLATION?[phone]}      Lab Results  Component Value Date   WBC 6.6 10/08/2022   HGB 12.2 10/08/2022   HCT 38.7 10/08/2022   MCV 97.5 10/08/2022   PLT 206 10/08/2022   Lab Results  Component Value Date   CREATININE 1.06 (H) 10/08/2022   BUN 23 (H) 10/08/2022   NA 138 10/08/2022   K 4.2 10/08/2022   CL 106 10/08/2022   CO2 21 (L) 10/08/2022   No results found for: "CHOL", "HDL", "LDLCALC", "LDLDIRECT", "  TRIG", "CHOLHDL"  No results found for: "HGBA1C" Assessment & Plan    1.  History of CVA:   2.  Essential hypertension   3.  Hyperlipidemia   4.  Schizoaffective disorder        Disposition: Follow-up with Charlton Haws, MD or APP in *** months {Are you ordering a CV Procedure (e.g. stress test, cath, DCCV, TEE, etc)?   Press F2        :[id number]}   Signed, Napoleon Form, Leodis Rains, NP 11/12/2023, 2:43 PM [facility]

## 2023-11-13 ENCOUNTER — Encounter: Payer: Self-pay | Admitting: Nurse Practitioner

## 2023-11-13 ENCOUNTER — Ambulatory Visit: Payer: BC Managed Care – PPO | Attending: Nurse Practitioner | Admitting: Nurse Practitioner

## 2023-11-13 VITALS — BP 122/84 | HR 85 | Ht 61.0 in | Wt 166.5 lb

## 2023-11-13 DIAGNOSIS — I10 Essential (primary) hypertension: Secondary | ICD-10-CM | POA: Insufficient documentation

## 2023-11-13 DIAGNOSIS — E782 Mixed hyperlipidemia: Secondary | ICD-10-CM | POA: Diagnosis not present

## 2023-11-13 DIAGNOSIS — I6381 Other cerebral infarction due to occlusion or stenosis of small artery: Secondary | ICD-10-CM | POA: Insufficient documentation

## 2023-11-13 DIAGNOSIS — F259 Schizoaffective disorder, unspecified: Secondary | ICD-10-CM | POA: Diagnosis not present

## 2023-11-13 NOTE — Patient Instructions (Signed)
Medication Instructions:  Your physician recommends that you continue on your current medications as directed. Please refer to the Current Medication list given to you today.  *If you need a refill on your cardiac medications before your next appointment, please call your pharmacy*   Lab Work: NONE  If you have labs (blood work) drawn today and your tests are completely normal, you will receive your results only by: MyChart Message (if you have MyChart) OR A paper copy in the mail If you have any lab test that is abnormal or we need to change your treatment, we will call you to review the results.   Testing/Procedures: NONE   Follow-Up: At [facility], you and your health needs are our priority.  As part of our continuing mission to provide you with exceptional heart care, we have created designated Provider Care Teams.  These Care Teams include your primary Cardiologist (physician) and Advanced Practice Providers (APPs -  Physician Assistants and Nurse Practitioners) who all work together to provide you with the care you need, when you need it.   Your next appointment:   1 year(s)  Provider:   Charlton Haws, MD  or Robin Searing, NP

## 2023-11-22 NOTE — Telephone Encounter (Signed)
Referral added

## 2023-11-23 DIAGNOSIS — F250 Schizoaffective disorder, bipolar type: Secondary | ICD-10-CM | POA: Diagnosis not present

## 2023-11-30 ENCOUNTER — Ambulatory Visit (INDEPENDENT_AMBULATORY_CARE_PROVIDER_SITE_OTHER): Payer: Medicare Other

## 2023-11-30 DIAGNOSIS — I6381 Other cerebral infarction due to occlusion or stenosis of small artery: Secondary | ICD-10-CM | POA: Diagnosis not present

## 2023-11-30 LAB — CUP PACEART REMOTE DEVICE CHECK
Date Time Interrogation Session: 20250206020844
Pulse Gen Serial Number: 511043327

## 2024-01-04 ENCOUNTER — Ambulatory Visit (INDEPENDENT_AMBULATORY_CARE_PROVIDER_SITE_OTHER): Payer: Medicare Other

## 2024-01-04 DIAGNOSIS — I6381 Other cerebral infarction due to occlusion or stenosis of small artery: Secondary | ICD-10-CM | POA: Diagnosis not present

## 2024-01-04 LAB — CUP PACEART REMOTE DEVICE CHECK
Date Time Interrogation Session: 20250313023129
Pulse Gen Model: 5000
Pulse Gen Serial Number: 511043327

## 2024-01-04 NOTE — Progress Notes (Signed)
 Carelink Summary Report / Loop Recorder

## 2024-02-01 ENCOUNTER — Ambulatory Visit: Payer: Medicare Other | Admitting: Neurology

## 2024-02-08 ENCOUNTER — Ambulatory Visit (INDEPENDENT_AMBULATORY_CARE_PROVIDER_SITE_OTHER): Payer: Medicare Other

## 2024-02-08 DIAGNOSIS — I6381 Other cerebral infarction due to occlusion or stenosis of small artery: Secondary | ICD-10-CM

## 2024-02-10 LAB — CUP PACEART REMOTE DEVICE CHECK
Date Time Interrogation Session: 20250417080152
Pulse Gen Model: 5000
Pulse Gen Serial Number: 511043327

## 2024-02-16 ENCOUNTER — Ambulatory Visit: Payer: Medicare Other | Admitting: Neurology

## 2024-02-16 NOTE — Progress Notes (Signed)
 Carelink Summary Report / Loop Recorder

## 2024-03-14 ENCOUNTER — Ambulatory Visit: Payer: Self-pay | Admitting: Cardiology

## 2024-03-14 ENCOUNTER — Ambulatory Visit (INDEPENDENT_AMBULATORY_CARE_PROVIDER_SITE_OTHER): Payer: Medicare Other

## 2024-03-14 DIAGNOSIS — I6381 Other cerebral infarction due to occlusion or stenosis of small artery: Secondary | ICD-10-CM

## 2024-03-14 LAB — CUP PACEART REMOTE DEVICE CHECK
Date Time Interrogation Session: 20250522050038
Pulse Gen Model: 5000
Pulse Gen Serial Number: 511043327

## 2024-03-20 NOTE — Progress Notes (Signed)
 Carelink Summary Report / Loop Recorder

## 2024-03-20 NOTE — Addendum Note (Signed)
 Addended by: Lott Rouleau A on: 03/20/2024 10:33 AM   Modules accepted: Orders

## 2024-04-14 ENCOUNTER — Ambulatory Visit (INDEPENDENT_AMBULATORY_CARE_PROVIDER_SITE_OTHER)

## 2024-04-14 DIAGNOSIS — I6381 Other cerebral infarction due to occlusion or stenosis of small artery: Secondary | ICD-10-CM | POA: Diagnosis not present

## 2024-04-15 ENCOUNTER — Ambulatory Visit: Payer: Self-pay | Admitting: Cardiology

## 2024-04-15 LAB — CUP PACEART REMOTE DEVICE CHECK
Date Time Interrogation Session: 20250622050525
Pulse Gen Model: 5000
Pulse Gen Serial Number: 511043327

## 2024-04-25 NOTE — Progress Notes (Signed)
 Carelink Summary Report / Loop Recorder

## 2024-04-29 ENCOUNTER — Telehealth: Payer: Self-pay | Admitting: Cardiology

## 2024-04-29 NOTE — Telephone Encounter (Signed)
 I asked the pt to send a manual transmission with her mymerlin app however, her husband is not home to help her send one. He gets home at 6 pm. I told her to have him send it when he get home and the nurse will review it to determined if the loop has just moved or actually out.

## 2024-04-29 NOTE — Telephone Encounter (Signed)
  1. Has your device fired? No   2. Is you device beeping? no  3. Are you experiencing draining or swelling at device site? No   4. Are you calling to see if we received your device transmission? No   5. Have you passed out? No  Pt has loop recorder and peg fell out of her chest.    Please route to Device Clinic Pool

## 2024-04-30 NOTE — Telephone Encounter (Signed)
 Attempted to contact patient. No answer, left message to call back.  Monitor last connected 04/30/24.

## 2024-05-02 NOTE — Telephone Encounter (Signed)
 I called and spoke with the patient. Advised that her monitor is still connecting as of today.  Made her aware that her ILR may have shifted slightly under her tissue to a place that she may not be able to feel this. She is aware that her next scheduled transmission is 05/15/24 and we will review this once it comes through and notify her if there are any abnormalities noted.   The patient voices understanding and is agreeable.

## 2024-05-15 ENCOUNTER — Ambulatory Visit (INDEPENDENT_AMBULATORY_CARE_PROVIDER_SITE_OTHER)

## 2024-05-15 DIAGNOSIS — I6381 Other cerebral infarction due to occlusion or stenosis of small artery: Secondary | ICD-10-CM | POA: Diagnosis not present

## 2024-05-15 LAB — CUP PACEART REMOTE DEVICE CHECK
Date Time Interrogation Session: 20250723050335
Pulse Gen Model: 5000
Pulse Gen Serial Number: 511043327

## 2024-05-15 NOTE — Telephone Encounter (Signed)
 Transmission received.  Device function WNL.

## 2024-05-16 ENCOUNTER — Ambulatory Visit: Payer: Self-pay | Admitting: Cardiology

## 2024-05-22 NOTE — Progress Notes (Signed)
 [employer]lin Loop [employer]

## 2024-05-22 NOTE — Addendum Note (Signed)
 Addended by: TAWNI DRILLING D on: 05/22/2024 02:32 PM   Modules accepted: Orders

## 2024-06-15 ENCOUNTER — Ambulatory Visit (INDEPENDENT_AMBULATORY_CARE_PROVIDER_SITE_OTHER)

## 2024-06-15 DIAGNOSIS — I6381 Other cerebral infarction due to occlusion or stenosis of small artery: Secondary | ICD-10-CM

## 2024-06-18 LAB — CUP PACEART REMOTE DEVICE CHECK
Date Time Interrogation Session: 20250823050343
Pulse Gen Model: 5000
Pulse Gen Serial Number: 511043327

## 2024-06-19 ENCOUNTER — Ambulatory Visit: Payer: Self-pay | Admitting: Cardiology

## 2024-06-25 ENCOUNTER — Other Ambulatory Visit: Payer: Self-pay | Admitting: Internal Medicine

## 2024-06-25 DIAGNOSIS — Z1231 Encounter for screening mammogram for malignant neoplasm of breast: Secondary | ICD-10-CM

## 2024-07-05 ENCOUNTER — Ambulatory Visit
Admission: RE | Admit: 2024-07-05 | Discharge: 2024-07-05 | Disposition: A | Discharge status: Home / Self Care | Source: Ambulatory Visit | Attending: Internal Medicine | Admitting: Internal Medicine

## 2024-07-05 DIAGNOSIS — Z1231 Encounter for screening mammogram for malignant neoplasm of breast: Secondary | ICD-10-CM

## 2024-07-10 NOTE — Progress Notes (Signed)
 Remote Loop Recorder Transmission

## 2024-07-16 ENCOUNTER — Ambulatory Visit (INDEPENDENT_AMBULATORY_CARE_PROVIDER_SITE_OTHER)

## 2024-07-16 DIAGNOSIS — I6381 Other cerebral infarction due to occlusion or stenosis of small artery: Secondary | ICD-10-CM

## 2024-07-17 ENCOUNTER — Ambulatory Visit: Payer: Self-pay | Admitting: Cardiology

## 2024-07-17 LAB — CUP PACEART REMOTE DEVICE CHECK
Date Time Interrogation Session: 20250923180015
Pulse Gen Model: 5000
Pulse Gen Serial Number: 511043327

## 2024-07-17 NOTE — Progress Notes (Signed)
 Remote Loop Recorder Transmission

## 2024-07-25 NOTE — Progress Notes (Signed)
 Remote Loop Recorder Transmission

## 2024-08-16 ENCOUNTER — Ambulatory Visit (INDEPENDENT_AMBULATORY_CARE_PROVIDER_SITE_OTHER)

## 2024-08-16 DIAGNOSIS — I6381 Other cerebral infarction due to occlusion or stenosis of small artery: Secondary | ICD-10-CM | POA: Diagnosis not present

## 2024-08-17 LAB — CUP PACEART REMOTE DEVICE CHECK
Date Time Interrogation Session: 20251024064324
Pulse Gen Model: 5000
Pulse Gen Serial Number: 511043327

## 2024-08-19 ENCOUNTER — Ambulatory Visit: Payer: Self-pay | Admitting: Cardiology

## 2024-08-19 NOTE — Progress Notes (Signed)
 Remote Loop Recorder Transmission

## 2024-09-16 ENCOUNTER — Ambulatory Visit (INDEPENDENT_AMBULATORY_CARE_PROVIDER_SITE_OTHER)

## 2024-09-16 DIAGNOSIS — I6381 Other cerebral infarction due to occlusion or stenosis of small artery: Secondary | ICD-10-CM

## 2024-09-17 LAB — CUP PACEART REMOTE DEVICE CHECK
Date Time Interrogation Session: 20251124055025
Pulse Gen Model: 5000
Pulse Gen Serial Number: 511043327

## 2024-09-18 ENCOUNTER — Ambulatory Visit: Payer: Self-pay | Admitting: Cardiology

## 2024-09-18 NOTE — Progress Notes (Signed)
 Remote Loop Recorder Transmission

## 2024-10-04 ENCOUNTER — Encounter: Payer: Self-pay | Admitting: Cardiovascular Disease

## 2024-10-17 ENCOUNTER — Ambulatory Visit

## 2024-10-17 DIAGNOSIS — I6381 Other cerebral infarction due to occlusion or stenosis of small artery: Secondary | ICD-10-CM | POA: Diagnosis not present

## 2024-10-17 LAB — CUP PACEART REMOTE DEVICE CHECK
Date Time Interrogation Session: 20251225045752
Pulse Gen Model: 5000
Pulse Gen Serial Number: 511043327

## 2024-10-18 NOTE — Progress Notes (Signed)
 Remote Loop Recorder Transmission

## 2024-10-23 ENCOUNTER — Ambulatory Visit: Payer: Self-pay | Admitting: Cardiovascular Disease

## 2024-10-26 ENCOUNTER — Encounter (HOSPITAL_COMMUNITY): Payer: Self-pay | Admitting: *Deleted

## 2024-10-26 ENCOUNTER — Ambulatory Visit (HOSPITAL_COMMUNITY)
Admission: EM | Admit: 2024-10-26 | Discharge: 2024-10-26 | Disposition: A | Discharge status: Home / Self Care | Attending: Emergency Medicine | Admitting: Emergency Medicine

## 2024-10-26 DIAGNOSIS — J069 Acute upper respiratory infection, unspecified: Secondary | ICD-10-CM | POA: Diagnosis not present

## 2024-10-26 MED ORDER — PROMETHAZINE-DM 6.25-15 MG/5ML PO SYRP: 5.0000 mL | ORAL_SOLUTION | Freq: Four times a day (QID) | ORAL | 0 refills | Status: DC | PRN

## 2024-10-26 MED ORDER — FLUTICASONE PROPIONATE 50 MCG/ACT NA SUSP: 1.0000 | Freq: Every day | NASAL | 0 refills | Status: AC

## 2024-10-26 MED ORDER — FLUTICASONE PROPIONATE 50 MCG/ACT NA SUSP: 1.0000 | Freq: Every day | NASAL | 0 refills | Status: DC

## 2024-10-26 MED ORDER — PROMETHAZINE-DM 6.25-15 MG/5ML PO SYRP: 5.0000 mL | ORAL_SOLUTION | Freq: Four times a day (QID) | ORAL | 0 refills | Status: AC | PRN

## 2024-10-26 NOTE — ED Triage Notes (Signed)
 Pt states she has cough, congestion, sore throat X 5 days. She took benadryl for her sx.   Pt is here today with husband

## 2024-10-26 NOTE — Discharge Instructions (Addendum)
 Use the nasal spray daily to help with congestion.  You can use the cough syrup up to 4 times daily as needed for cough.  This medication may cause drowsiness or sedation.  It may also help loosen up any secretions.  Ensure you are drinking at least 64 ounces of water daily.  Can alternate between Tylenol and ibuprofen for any aches or pains.  Viral illnesses typically last around a week.  For any prolonged symptoms or new concerning symptoms seek follow-up care.

## 2024-10-26 NOTE — ED Provider Notes (Signed)
 " [facility]    CSN: [id number] Arrival date & time: 10/26/24  8082      History   Chief Complaint Chief Complaint  Patient presents with   Cough   Nasal Congestion   Sore Throat    HPI Deborah Moore is a 59 y.o. female.   Patient presents to clinic with her husband over concern of cough, congestion and sore throat for the past 5 days.  She has tried Benadryl for her symptoms.  Husband helps provide history.  She does have a history of ischemic stroke.  Husband feels like she is wheezing.  Patient has not checked temperature but does feel like she is cold.   The history is provided by the patient, medical records and the spouse.  Cough Sore Throat    Past Medical History:  Diagnosis Date   Anxiety    Dizziness    Fibromuscular dysplasia    Headache    migraines   Hypertension    Hypothyroidism    Ischemic stroke (HCC)     There are no active problems to display for this patient.   Past Surgical History:  Procedure Laterality Date   COLONOSCOPY WITH PROPOFOL  N/A 01/26/2016   Procedure: COLONOSCOPY WITH PROPOFOL ;  Surgeon: Gladis MARLA Louder, MD;  Location: [facility];  Service: Endoscopy;  Laterality: N/A;   NO PAST SURGERIES      OB History   No obstetric history on file.      Home Medications    Prior to Admission medications  Medication Sig Start Date End Date Taking? Authorizing Provider  aspirin EC 81 MG tablet Take 81 mg by mouth daily.    Yes [provider]  atenolol (TENORMIN) 25 MG tablet Take 25 mg by mouth daily with breakfast.  12/30/15  Yes [provider]  clonazePAM (KLONOPIN) 1 MG tablet Take 1 mg by mouth 2 (two) times daily as needed for anxiety.  01/15/16  Yes [provider]  fluticasone  (FLONASE ) 50 MCG/ACT nasal spray Place 1 spray into both nostrils daily. 10/26/24  Yes Kathlyne Loud  N, FNP  haloperidol (HALDOL) 5 MG tablet Take 5 mg by mouth at bedtime. 12/22/15  Yes [provider]   levothyroxine (SYNTHROID) 50 MCG tablet Take 50 mcg by mouth daily. 07/19/22  Yes [provider]  Multiple Vitamin (MULTIVITAMIN WITH MINERALS) TABS tablet Take 1 tablet by mouth daily. *Solgar*   Yes [provider]  promethazine -dextromethorphan (PROMETHAZINE -DM) 6.25-15 MG/5ML syrup Take 5 mLs by mouth 4 (four) times daily as needed for cough. 10/26/24  Yes Caci Orren  N, FNP  sertraline (ZOLOFT) 100 MG tablet Take 100 mg by mouth every morning. 12/30/15  Yes [provider]  simvastatin (ZOCOR) 20 MG tablet Take 20 mg by mouth daily.  12/29/15  Yes [provider]  traZODone (DESYREL) 150 MG tablet Take 150 mg by mouth daily.   Yes [provider]  azelastine (ASTELIN) 0.1 % nasal spray Place 2 sprays into both nostrils 2 (two) times daily. Use in each nostril as directed    [provider]  ipratropium (ATROVENT ) 0.06 % nasal spray  03/13/18   [provider]    Family History Family History  Problem Relation Age of Onset   Stroke Mother    Dementia Mother    Stroke Father    Breast cancer Neg Hx     Social History Social History[1]   Allergies   Other   Review of Systems Review of  Systems  Per HPI  Physical Exam Triage Vital Signs ED Triage Vitals  Encounter Vitals Group     BP 10/26/24 1934 106/60     Girls Systolic BP Percentile --      Girls Diastolic BP Percentile --      Boys Systolic BP Percentile --      Boys Diastolic BP Percentile --      Pulse Rate 10/26/24 1934 100     Resp 10/26/24 1934 20     Temp 10/26/24 1934 97.8 F (36.6 C)     Temp Source 10/26/24 1934 Oral     SpO2 10/26/24 1934 93 %     Weight --      Height --      Head Circumference --      Peak Flow --      Pain Score 10/26/24 1932 4     Pain Loc --      Pain Education --      Exclude from Growth Chart --    No data found.  Updated Vital Signs BP 106/60 (BP Location: Left Arm)   Pulse 100   Temp 97.8 F (36.6 C)  (Oral)   Resp 20   SpO2 93%   Visual Acuity Right Eye Distance:   Left Eye Distance:   Bilateral Distance:    Right Eye Near:   Left Eye Near:    Bilateral Near:     Physical Exam Vitals and nursing note reviewed.  Constitutional:      Appearance: Normal appearance.  HENT:     Head: Normocephalic and atraumatic.     Right Ear: External ear normal.     Left Ear: External ear normal.     Nose: Congestion and rhinorrhea present.     Mouth/Throat:     Mouth: Mucous membranes are moist.     Pharynx: Posterior oropharyngeal erythema present.  Eyes:     Conjunctiva/sclera: Conjunctivae normal.  Cardiovascular:     Rate and Rhythm: Normal rate and regular rhythm.     Heart sounds: Normal heart sounds. No murmur heard. Pulmonary:     Effort: Pulmonary effort is normal. No respiratory distress.     Breath sounds: Normal breath sounds. No wheezing.  Skin:    General: Skin is warm.  Neurological:     General: No focal deficit present.     Mental Status: She is alert.  Psychiatric:        Mood and Affect: Mood normal.      UC Treatments / Results  Labs (all labs ordered are listed, but only abnormal results are displayed) Labs Reviewed - No data to display  EKG   Radiology No results found.  Procedures Procedures (including critical care time)  Medications Ordered in UC Medications - No data to display  Initial Impression / Assessment and Plan / UC Course  I have reviewed the triage vital signs and the nursing notes.  Pertinent labs & imaging results that were available during my care of the patient were reviewed by me and considered in my medical decision making (see chart for details).  Vitals and triage reviewed, patient is hemodynamically stable.  Lungs vesicular, heart with regular rate and rhythm.  Congestion and posterior pharynx erythema present on physical exam.  Suspect viral URI.  Offered viral testing but discussed she is outside of window for  treatment with Tamiflu and symptoms do not fit influenza.  We shared decision making, viral testing deferred.    Symptomatic  management for viral URI discussed.  Plan of care, follow-up care and return precautions given, no questions at this time.    Final Clinical Impressions(s) / UC Diagnoses   Final diagnoses:  Viral URI with cough     Discharge Instructions      Use the nasal spray daily to help with congestion.  You can use the cough syrup up to 4 times daily as needed for cough.  This medication may cause drowsiness or sedation.  It may also help loosen up any secretions.  Ensure you are drinking at least 64 ounces of water daily.  Can alternate between Tylenol and ibuprofen for any aches or pains.  Viral illnesses typically last around a week.  For any prolonged symptoms or new concerning symptoms seek follow-up care.      ED Prescriptions     Medication Sig Dispense Auth. Provider   promethazine -dextromethorphan (PROMETHAZINE -DM) 6.25-15 MG/5ML syrup Take 5 mLs by mouth 4 (four) times daily as needed for cough. 118 mL Dreama, Tomasa Dobransky  N, FNP   fluticasone  (FLONASE ) 50 MCG/ACT nasal spray Place 1 spray into both nostrils daily. 9.9 mL Dreama, Elyana Grabski  N, FNP      PDMP not reviewed this encounter.     [1]  Social History Tobacco Use   Smoking status: Never   Smokeless tobacco: Never  Vaping Use   Vaping status: Never Used  Substance Use Topics   Alcohol use: No   Drug use: No     Dreama Susan SAILOR, FNP 10/26/24 2008  "

## 2024-11-17 ENCOUNTER — Ambulatory Visit: Attending: Cardiovascular Disease

## 2024-11-17 DIAGNOSIS — I6381 Other cerebral infarction due to occlusion or stenosis of small artery: Secondary | ICD-10-CM

## 2024-11-18 LAB — CUP PACEART REMOTE DEVICE CHECK
Date Time Interrogation Session: 20260125061615
Pulse Gen Model: 5000
Pulse Gen Serial Number: 511043327

## 2024-11-19 ENCOUNTER — Ambulatory Visit: Payer: Self-pay | Admitting: Cardiovascular Disease

## 2024-11-21 NOTE — Progress Notes (Signed)
 Remote Loop Recorder Transmission

## 2024-12-18 ENCOUNTER — Ambulatory Visit
